# Patient Record
Sex: Male | Born: 1985 | Race: Black or African American | Hispanic: No | Marital: Single | Smoking: Current every day smoker
Health system: Southern US, Community
[De-identification: ages and names within clinical notes are randomized; demographics above are authoritative.]

## PROBLEM LIST (undated history)

## (undated) DIAGNOSIS — F909 Attention-deficit hyperactivity disorder, unspecified type: Secondary | ICD-10-CM

## (undated) DIAGNOSIS — J302 Other seasonal allergic rhinitis: Secondary | ICD-10-CM

---
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
## 2006-09-27 ENCOUNTER — Emergency Department (HOSPITAL_COMMUNITY): Admission: EM | Admit: 2006-09-27 | Discharge: 2006-09-27 | Payer: Self-pay | Admitting: Emergency Medicine

## 2007-12-31 ENCOUNTER — Emergency Department (HOSPITAL_COMMUNITY): Admission: EM | Admit: 2007-12-31 | Discharge: 2007-12-31 | Payer: Self-pay | Admitting: Emergency Medicine

## 2008-03-01 ENCOUNTER — Emergency Department (HOSPITAL_COMMUNITY): Admission: EM | Admit: 2008-03-01 | Discharge: 2008-03-01 | Payer: Self-pay | Admitting: Emergency Medicine

## 2011-06-29 ENCOUNTER — Emergency Department (HOSPITAL_COMMUNITY)
Admission: EM | Admit: 2011-06-29 | Discharge: 2011-06-29 | Disposition: A | Discharge status: Home / Self Care | Payer: Managed Care, Other (non HMO) | Attending: Emergency Medicine | Admitting: Emergency Medicine

## 2011-06-29 ENCOUNTER — Encounter: Payer: Self-pay | Admitting: *Deleted

## 2011-06-29 DIAGNOSIS — F419 Anxiety disorder, unspecified: Secondary | ICD-10-CM

## 2011-06-29 DIAGNOSIS — F909 Attention-deficit hyperactivity disorder, unspecified type: Secondary | ICD-10-CM | POA: Insufficient documentation

## 2011-06-29 DIAGNOSIS — IMO0002 Reserved for concepts with insufficient information to code with codable children: Secondary | ICD-10-CM | POA: Insufficient documentation

## 2011-06-29 DIAGNOSIS — F411 Generalized anxiety disorder: Secondary | ICD-10-CM | POA: Insufficient documentation

## 2011-06-29 HISTORY — DX: Attention-deficit hyperactivity disorder, unspecified type: F90.9
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
## 2011-06-29 NOTE — ED Notes (Signed)
Patient states that he lost his phone today and became very upset and according to his mom, patient states that "he was going to drive the car over a cliff!"  Patient states that he was very angry and denies any suicidal ideations

## 2011-06-29 NOTE — ED Notes (Signed)
Patient lost his phone today and became very upset to the point he wanted to drive his car off a cliff states he was just upset and not really wanting to hurt himself.

## 2011-06-30 NOTE — ED Provider Notes (Signed)
Medical screening examination/treatment/procedure(s) were performed by non-physician practitioner and as supervising physician I was immediately available for consultation/collaboration.   Andrew King, MD 06/30/11 0142 

## 2011-11-29 ENCOUNTER — Emergency Department (HOSPITAL_COMMUNITY)
Admission: EM | Admit: 2011-11-29 | Discharge: 2011-11-30 | Disposition: A | Discharge status: Home / Self Care | Payer: Managed Care, Other (non HMO) | Attending: Emergency Medicine | Admitting: Emergency Medicine

## 2011-11-29 ENCOUNTER — Emergency Department (HOSPITAL_COMMUNITY): Payer: Managed Care, Other (non HMO)

## 2011-11-29 ENCOUNTER — Encounter (HOSPITAL_COMMUNITY): Payer: Self-pay | Admitting: Emergency Medicine

## 2011-11-29 DIAGNOSIS — J069 Acute upper respiratory infection, unspecified: Secondary | ICD-10-CM | POA: Insufficient documentation

## 2011-11-29 DIAGNOSIS — R42 Dizziness and giddiness: Secondary | ICD-10-CM | POA: Insufficient documentation

## 2011-11-29 DIAGNOSIS — J329 Chronic sinusitis, unspecified: Secondary | ICD-10-CM | POA: Insufficient documentation

## 2011-11-29 DIAGNOSIS — R0602 Shortness of breath: Secondary | ICD-10-CM | POA: Insufficient documentation

## 2011-11-29 DIAGNOSIS — R509 Fever, unspecified: Secondary | ICD-10-CM | POA: Insufficient documentation

## 2011-11-29 DIAGNOSIS — F17200 Nicotine dependence, unspecified, uncomplicated: Secondary | ICD-10-CM | POA: Insufficient documentation

## 2011-11-29 HISTORY — DX: Other seasonal allergic rhinitis: J30.2

## 2011-11-29 MED ORDER — ACETAMINOPHEN 325 MG PO TABS
650.0000 mg | ORAL_TABLET | Freq: Once | ORAL | Status: AC
  Administered 2011-11-29: 650 mg via ORAL
  Filled 2011-11-29: qty 2

## 2011-11-29 NOTE — ED Notes (Signed)
Remains in waiting area.  Fever reduced.  Updated on wait for treatment room.

## 2011-11-29 NOTE — ED Notes (Signed)
PT. REPORTS NASAL CONGESTION , DRY COUGH ,SOB /DIZZINESS FOR SEVERAL WEEKS UNRELIEVED BY OTC ALLERGY MEDICATIONS/MUCINEX.

## 2011-11-30 MED ORDER — AZITHROMYCIN 250 MG PO TABS: 250.0000 mg | ORAL_TABLET | Freq: Every day | ORAL | Status: AC

## 2011-11-30 MED ORDER — FLUTICASONE PROPIONATE 50 MCG/ACT NA SUSP: 2.0000 | Freq: Every day | NASAL | Status: DC

## 2011-11-30 MED ORDER — CETIRIZINE HCL 10 MG PO TABS: 10.0000 mg | ORAL_TABLET | Freq: Every day | ORAL | Status: DC

## 2011-11-30 NOTE — ED Provider Notes (Signed)
Medical screening examination/treatment/procedure(s) were performed by non-physician practitioner and as supervising physician I was immediately available for consultation/collaboration.   Lyanne Co, MD 11/30/11 [phone]

## 2011-11-30 NOTE — Discharge Instructions (Signed)
Mason Lopez's symptoms are most likely are due to a viral upper respiratory and sinus infection. Make sure to take zyrtec daily. Take sudafed for congestion. Saline nasal spray every  2 hrs. Flonase daily. Drink plenty of fluids, rest. Take zithromax if symptoms are not improving in several days. Follow up with your doctor.   Viral Syndrome You or your child has Viral Syndrome. It is the most common infection causing "colds" and infections in the nose, throat, sinuses, and breathing tubes. Sometimes the infection causes nausea, vomiting, or diarrhea. The germ that causes the infection is a virus. No antibiotic or other medicine will kill it. There are medicines that you can take to make you or your child more comfortable.  HOME CARE INSTRUCTIONS   Rest in bed until you start to feel better.   If you have diarrhea or vomiting, eat small amounts of crackers and toast. Soup is helpful.   Do not give aspirin or medicine that contains aspirin to children.   Only take over-the-counter or prescription medicines for pain, discomfort, or fever as directed by your caregiver.  SEEK IMMEDIATE MEDICAL CARE IF:   You or your child has not improved within one week.   You or your child has pain that is not at least partially relieved by over-the-counter medicine.   Thick, colored mucus or blood is coughed up.   Discharge from the nose becomes thick yellow or green.   Diarrhea or vomiting gets worse.   There is any major change in your or your child's condition.   You or your child develops a skin rash, stiff neck, severe headache, or are unable to hold down food or fluid.   You or your child has an oral temperature above 102 F (38.9 C), not controlled by medicine.   Your baby is older than 3 months with a rectal temperature of 102 F (38.9 C) or higher.   Your baby is 32 months old or younger with a rectal temperature of 100.4 F (38 C) or higher.  Document Released: 06/21/2006 Document Revised:  06/25/2011 Document Reviewed: 06/22/2007 [facility] Patient Information 2012 [place], [place]. Antibiotic Nonuse  Your caregiver felt that the infection or problem was not one that would be helped with an antibiotic. Infections may be caused by viruses or bacteria. Only a caregiver can tell which one of these is the likely cause of an illness. A cold is the most common cause of infection in both adults and children. A cold is a virus. Antibiotic treatment will have no effect on a viral infection. Viruses can lead to many lost days of work caring for sick children and many missed days of school. Children may catch as many as 10 "colds" or "flus" per year during which they can be tearful, cranky, and uncomfortable. The goal of treating a virus is aimed at keeping the ill person comfortable. Antibiotics are medications used to help the body fight bacterial infections. There are relatively few types of bacteria that cause infections but there are hundreds of viruses. While both viruses and bacteria cause infection they are very different types of germs. A viral infection will typically go away by itself within 7 to 10 days. Bacterial infections may spread or get worse without antibiotic treatment. Examples of bacterial infections are:  Sore throats (like strep throat or tonsillitis).   Infection in the lung (pneumonia).   Ear and skin infections.  Examples of viral infections are:  Colds or flus.   Most coughs and bronchitis.  Sore throats not caused by Strep.   Runny noses.  It is often best not to take an antibiotic when a viral infection is the cause of the problem. Antibiotics can kill off the helpful bacteria that we have inside our body and allow harmful bacteria to start growing. Antibiotics can cause side effects such as allergies, nausea, and diarrhea without helping to improve the symptoms of the viral infection. Additionally, repeated uses of antibiotics can cause bacteria inside of our  body to become resistant. That resistance can be passed onto harmful bacterial. The next time you have an infection it may be harder to treat if antibiotics are used when they are not needed. Not treating with antibiotics allows our own immune system to develop and take care of infections more efficiently. Also, antibiotics will work better for [place] when they are prescribed for bacterial infections. Treatments for a child that is ill may include:  Give extra fluids throughout the day to stay hydrated.   Get plenty of rest.   Only give your child over-the-counter or prescription medicines for pain, discomfort, or fever as directed by your caregiver.   The use of a cool mist humidifier may help stuffy noses.   Cold medications if suggested by your caregiver.  Your caregiver may decide to start you on an antibiotic if:  The problem you were seen for today continues for a longer length of time than expected.   You develop a secondary bacterial infection.  SEEK MEDICAL CARE IF:  Fever lasts longer than 5 days.   Symptoms continue to get worse after 5 to 7 days or become severe.   Difficulty in breathing develops.   Signs of dehydration develop (poor drinking, rare urinating, dark colored urine).   Changes in behavior or worsening tiredness (listlessness or lethargy).  Document Released: 09/14/2001 Document Revised: 06/25/2011 Document Reviewed: 03/13/2009 [facility] Patient Information 2012 [place], [place].

## 2011-11-30 NOTE — ED Provider Notes (Signed)
History     CSN: [id number]  Arrival date & time 11/29/11  2219   First MD Initiated Contact with Patient 11/30/11 0009      Chief Complaint  Patient presents with  . Allergies    (Consider location/radiation/quality/duration/timing/severity/associated sxs/prior treatment) Patient is a 26 y.o. male presenting with URI. The history is provided by the patient.  URI The primary symptoms include fever, sore throat, cough and myalgias. Primary symptoms do not include headaches, ear pain, abdominal pain, nausea or vomiting. The current episode started 3 to 5 days ago. This is a new problem. The problem has been gradually worsening.  Symptoms associated with the illness include chills, plugged ear sensation, facial pain, sinus pressure, congestion and rhinorrhea.  PT states he tried to take mucinex and over the counter cold medications with no relief. Nothing makes his symptoms better or worse. Denies headache, neck pain or stiffness.  Past Medical History  Diagnosis Date  . ADHD (attention deficit hyperactivity disorder)   . Seasonal allergies   . Asthma     History reviewed. No pertinent past surgical history.  No family history on file.  History  Substance Use Topics  . Smoking status: Current Everyday Smoker -- 0.5 packs/day  . Smokeless tobacco: Not on file  . Alcohol Use: Yes      Review of Systems  Constitutional: Positive for fever and chills.  HENT: Positive for congestion, sore throat, rhinorrhea and sinus pressure. Negative for ear pain, neck pain and neck stiffness.   Respiratory: Positive for cough. Negative for chest tightness and shortness of breath.   Cardiovascular: Negative.   Gastrointestinal: Negative for nausea, vomiting and abdominal pain.  Musculoskeletal: Positive for myalgias.  Skin: Negative.   Neurological: Positive for dizziness. Negative for headaches.    Allergies  Review of patient's allergies indicates no known allergies.  Home  Medications   Current Outpatient Rx  Name Route Sig Dispense Refill  . CETIRIZINE HCL 10 MG PO TABS Oral Take 10 mg by mouth daily.      BP 125/76  Pulse 95  Temp(Src) 100.1 F (37.8 C) (Oral)  Resp 18  SpO2 99%  Physical Exam  Nursing note and vitals reviewed. Constitutional: He is oriented to person, place, and time. He appears well-developed and well-nourished. No distress.  HENT:  Head: Normocephalic and atraumatic.  Right Ear: Tympanic membrane, external ear and ear canal normal.  Left Ear: Tympanic membrane, external ear and ear canal normal.  Nose: Rhinorrhea present. Right sinus exhibits maxillary sinus tenderness. Right sinus exhibits no frontal sinus tenderness. Left sinus exhibits maxillary sinus tenderness. Left sinus exhibits no frontal sinus tenderness.  Mouth/Throat: Uvula is midline, oropharynx is clear and moist and mucous membranes are normal.  Eyes: Conjunctivae are normal.  Neck: Normal range of motion. Neck supple.  Cardiovascular: Normal rate, regular rhythm and normal heart sounds.   Pulmonary/Chest: Effort normal and breath sounds normal. No respiratory distress. He has no wheezes.  Abdominal: Bowel sounds are normal. He exhibits no distension. There is no tenderness.  Lymphadenopathy:    He has no cervical adenopathy.  Neurological: He is alert and oriented to person, place, and time.  Skin: Skin is warm and dry.  Psychiatric: He has a normal mood and affect.    ED Course  Procedures (including critical care time)  Labs Reviewed - No data to display Dg Chest 2 View  11/29/2011  *RADIOLOGY REPORT*  Clinical Data: Cough and shortness of breath for 1 week  CHEST -  2 VIEW  Comparison: None.  Findings: Normal heart size and pulmonary vascularity.  No focal airspace consolidation in the lungs.  No blunting of costophrenic angles.  No pneumothorax.  IMPRESSION: No evidence of active pulmonary disease.  Original Report Authenticated By: Marlon Pel,  M.D.   PT with URI symptoms. Fever of 103.1 on presentation. VS normal otherwise. He does not look toxic. His CXR negative, he is otherwise healthy. Lungs clear. NO meningismus.  Mother asking for antibitoic, explained to her, this is most likely a viral infection and does not require antibiotics. WIll give her one to hold on to, if symptoms not improving.   1. URI (upper respiratory infection)   2. Sinusitis       MDM         Lottie Mussel, PA 11/30/11 0126

## 2011-11-30 NOTE — ED Notes (Signed)
Patient complaining of nasal congestion, "stuffy head", chills and shortness of breath since Thursday.  Patient states that he has year-round allergies and asthma problems (when he was a kid; reports no problems in the last several years).  Denies chset pain.  Patient alert and oriented x4; PERRL present.  Upon arrival to room, patient changed into gown and connected to continuous blood pressure and pulse ox.  Family present at bedside.  Will continue to monitor.

## 2011-11-30 NOTE — ED Notes (Signed)
Patient given discharge paperwork; went over discharge instructions with patient.  Patient instructed to take prescriptions as directed, to take OTC saline nasal spray, to follow up with primary care physician if symptoms do not improve, to get plenty of rest and drink plenty of fluids, and to return to the ED for new, worsening, or concerning symptoms.

## 2012-12-18 ENCOUNTER — Encounter (HOSPITAL_COMMUNITY): Payer: Self-pay | Admitting: *Deleted

## 2012-12-18 ENCOUNTER — Emergency Department (HOSPITAL_COMMUNITY)
Admission: EM | Admit: 2012-12-18 | Discharge: 2012-12-18 | Disposition: A | Discharge status: Home / Self Care | Payer: Managed Care, Other (non HMO) | Attending: Emergency Medicine | Admitting: Emergency Medicine

## 2012-12-18 DIAGNOSIS — Z0289 Encounter for other administrative examinations: Secondary | ICD-10-CM | POA: Insufficient documentation

## 2012-12-18 DIAGNOSIS — F17200 Nicotine dependence, unspecified, uncomplicated: Secondary | ICD-10-CM | POA: Insufficient documentation

## 2012-12-18 DIAGNOSIS — Z008 Encounter for other general examination: Secondary | ICD-10-CM

## 2012-12-18 DIAGNOSIS — F1210 Cannabis abuse, uncomplicated: Secondary | ICD-10-CM | POA: Insufficient documentation

## 2012-12-18 DIAGNOSIS — F909 Attention-deficit hyperactivity disorder, unspecified type: Secondary | ICD-10-CM | POA: Insufficient documentation

## 2012-12-18 DIAGNOSIS — J45909 Unspecified asthma, uncomplicated: Secondary | ICD-10-CM | POA: Insufficient documentation

## 2012-12-18 LAB — RAPID URINE DRUG SCREEN, HOSP PERFORMED
Amphetamines: NOT DETECTED
Barbiturates: NOT DETECTED
Benzodiazepines: NOT DETECTED
Cocaine: NOT DETECTED
Opiates: NOT DETECTED
Tetrahydrocannabinol: POSITIVE — AB

## 2012-12-18 LAB — COMPREHENSIVE METABOLIC PANEL
ALT: 22 U/L (ref 0–53)
AST: 26 U/L (ref 0–37)
Albumin: 4.1 g/dL (ref 3.5–5.2)
Alkaline Phosphatase: 63 U/L (ref 39–117)
BUN: 11 mg/dL (ref 6–23)
CO2: 30 mEq/L (ref 19–32)
Calcium: 9.6 mg/dL (ref 8.4–10.5)
Chloride: 101 mEq/L (ref 96–112)
Creatinine, Ser: 1.03 mg/dL (ref 0.50–1.35)
GFR calc Af Amer: >90 mL/min (ref >90)
GFR calc non Af Amer: >90 mL/min (ref >90)
Glucose, Bld: 87 mg/dL (ref 70–99)
Potassium: 3.8 mEq/L (ref 3.5–5.1)
Sodium: 140 mEq/L (ref 135–145)
Total Bilirubin: 1.1 mg/dL (ref 0.3–1.2)
Total Protein: 7.9 g/dL (ref 6.0–8.3)

## 2012-12-18 LAB — ACETAMINOPHEN LEVEL: Acetaminophen (Tylenol), Serum: <15 ug/mL (ref 10–30)

## 2012-12-18 LAB — CBC
HCT: 43 % (ref 39.0–52.0)
Hemoglobin: 15.3 g/dL (ref 13.0–17.0)
MCH: 31.2 pg (ref 26.0–34.0)
MCHC: 35.6 g/dL (ref 30.0–36.0)
MCV: 87.8 fL (ref 78.0–100.0)
Platelets: 262 10*3/uL (ref 150–400)
RBC: 4.9 MIL/uL (ref 4.22–5.81)
RDW: 12.4 % (ref 11.5–15.5)
WBC: 7.9 10*3/uL (ref 4.0–10.5)

## 2012-12-18 LAB — SALICYLATE LEVEL: Salicylate Lvl: <2 mg/dL — ABNORMAL LOW (ref 2.8–20.0)

## 2012-12-18 LAB — ETHANOL: Alcohol, Ethyl (B): <11 mg/dL (ref 0–11)

## 2012-12-18 MED ORDER — NICOTINE 21 MG/24HR TD PT24: 21.0000 mg | MEDICATED_PATCH | Freq: Every day | TRANSDERMAL | Status: DC

## 2012-12-18 MED ORDER — LORAZEPAM 1 MG PO TABS: 1.0000 mg | ORAL_TABLET | Freq: Three times a day (TID) | ORAL | Status: DC | PRN

## 2012-12-18 MED ORDER — IBUPROFEN 600 MG PO TABS: 600.0000 mg | ORAL_TABLET | Freq: Three times a day (TID) | ORAL | Status: DC | PRN

## 2012-12-18 MED ORDER — ONDANSETRON HCL 4 MG PO TABS: 4.0000 mg | ORAL_TABLET | Freq: Three times a day (TID) | ORAL | Status: DC | PRN

## 2012-12-18 MED ORDER — ZOLPIDEM TARTRATE 5 MG PO TABS: 5.0000 mg | ORAL_TABLET | Freq: Every evening | ORAL | Status: DC | PRN

## 2012-12-18 MED ORDER — LORATADINE 10 MG PO TABS
10.0000 mg | ORAL_TABLET | Freq: Every day | ORAL | Status: DC
  Filled 2012-12-18: qty 1

## 2012-12-18 MED ORDER — ALUM & MAG HYDROXIDE-SIMETH 200-200-20 MG/5ML PO SUSP: 30.0000 mL | ORAL | Status: DC | PRN
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
## 2012-12-18 NOTE — ED Notes (Signed)
Pt wanded by security, 1 pt belonging bag.

## 2012-12-18 NOTE — ED Notes (Signed)
Telepsych info faxed & called in.
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
## 2012-12-18 NOTE — [facility] Assessment (Signed)
[facility] Assessment Progress Note    Spoke to Girard the PA and she informed [facility] pt is petitioned by his mother.  Pt has been cooperative in ED.  Pt is concerned about work Advertising account executive.  Pt will be Tele Psych by ED to determine if pt needs to be IVC.  If Tele Psych examination upholds IVC then ACT will conduct and assessment and begin to pursue placement options.    However, if Tele Psych rescinds IVC after as a result of their examination then [facility] will not be needed except to offer referral information.    PA was in agreement with the process of the above plan.

## 2012-12-19 NOTE — ED Provider Notes (Signed)
Medical screening examination/treatment/procedure(s) were performed by non-physician practitioner and as supervising physician I was immediately available for consultation/collaboration.  Burlie Cajamarca, MD 12/19/12 1548 

## 2014-10-21 ENCOUNTER — Encounter (HOSPITAL_COMMUNITY): Payer: Self-pay | Admitting: Emergency Medicine

## 2014-10-21 ENCOUNTER — Emergency Department (INDEPENDENT_AMBULATORY_CARE_PROVIDER_SITE_OTHER)
Admission: EM | Admit: 2014-10-21 | Discharge: 2014-10-21 | Disposition: A | Discharge status: Home / Self Care | Payer: Self-pay | Source: Home / Self Care | Attending: Emergency Medicine | Admitting: Emergency Medicine

## 2014-10-21 DIAGNOSIS — H66002 Acute suppurative otitis media without spontaneous rupture of ear drum, left ear: Secondary | ICD-10-CM

## 2014-10-21 DIAGNOSIS — K0889 Other specified disorders of teeth and supporting structures: Secondary | ICD-10-CM

## 2014-10-21 DIAGNOSIS — K088 Other specified disorders of teeth and supporting structures: Secondary | ICD-10-CM

## 2014-10-21 DIAGNOSIS — S025XXA Fracture of tooth (traumatic), initial encounter for closed fracture: Secondary | ICD-10-CM

## 2014-10-21 MED ORDER — AMOXICILLIN 500 MG PO CAPS: 500.0000 mg | ORAL_CAPSULE | Freq: Three times a day (TID) | ORAL | Status: DC

## 2014-10-21 MED ORDER — HYDROCODONE-ACETAMINOPHEN 5-325 MG PO TABS: 1.0000 | ORAL_TABLET | ORAL | Status: DC | PRN

## 2014-10-21 MED ORDER — MONTELUKAST SODIUM 10 MG PO TABS
10.0000 mg | ORAL_TABLET | Freq: Every day | ORAL | Status: DC
Start: 2014-10-21 — End: 2015-01-23

## 2014-10-21 NOTE — Discharge Instructions (Signed)
You have an infection in your left ear. Take amoxicillin 1 pill 3 times a day for 1 week. Add Singulair to your allergy regimen. Please follow-up with a dentist as soon as possible for evaluation of your chipped tooth and also the jaw pain you are having.

## 2014-10-21 NOTE — ED Notes (Signed)
C/o left side dental pain which radiates to left ear and temple which started yesterday States pain is a pressure pain Left side of face is swollen Did admit to chipping tooth last week Aleve and mouth wash used as tx

## 2014-10-21 NOTE — ED Provider Notes (Signed)
CSN: [id number]     Arrival date & time 10/21/14  1129 History   First MD Initiated Contact with Patient 10/21/14 1153     Chief Complaint  Patient presents with  . Dental Pain   (Consider location/radiation/quality/duration/timing/severity/associated sxs/prior Treatment) HPI  He is a 29 year old man here for evaluation of dental pain. He states this started yesterday. It is in his left upper jaw by the posterior 2 teeth. He describes it as a pinching pressure sensation. It radiates into his left ear and left temple. No pain with jaw movement. He states he did chip his left lower molar a week ago, but that has not caused him any pain. He reports some swelling of the left side of his face. No fevers. He has tried Aleve without any improvement. He does have dental insurance, but does not have a dentist at this time.  He states he does have bad allergies. He reports worse congestion over the last few days than previously. He is taking Claritin daily.  Past Medical History  Diagnosis Date  . ADHD (attention deficit hyperactivity disorder)   . Seasonal allergies   . Asthma    History reviewed. No pertinent past surgical history. History reviewed. No pertinent family history. History  Substance Use Topics  . Smoking status: Current Every Day Smoker -- 0.50 packs/day  . Smokeless tobacco: Never Used  . Alcohol Use: Yes    Review of Systems  Constitutional: Negative for fever and chills.  HENT: Positive for dental problem and ear pain. Negative for congestion and rhinorrhea.     Allergies  Peanuts  Home Medications   Prior to Admission medications   Medication Sig Start Date End Date Taking? Authorizing Provider  amoxicillin (AMOXIL) 500 MG capsule Take 1 capsule (500 mg total) by mouth 3 (three) times daily. 10/21/14   Charm RingsErin J Zarayah Lanting, MD  cetirizine (ZYRTEC) 10 MG tablet Take 10 mg by mouth daily.    Historical Provider, MD  HYDROcodone-acetaminophen (NORCO/VICODIN) 5-325 MG per tablet  Take 1 tablet by mouth every 4 (four) hours as needed for moderate pain. 10/21/14   Charm RingsErin J Leidi Astle, MD  montelukast (SINGULAIR) 10 MG tablet Take 1 tablet (10 mg total) by mouth at bedtime. 10/21/14   Charm RingsErin J Naina Sleeper, MD   BP 151/98 mmHg  Pulse 64  Temp(Src) 98 F (36.7 C) (Oral)  Resp 20  SpO2 100% Physical Exam  Constitutional: He is oriented to person, place, and time. He appears well-developed and well-nourished. No distress.  HENT:  No tenderness to percussion of the involved teeth. No appreciable erythema of the gumline. Left TM is erythematous and bulging.  Neck: Neck supple.  Cardiovascular: Normal rate.   Pulmonary/Chest: Effort normal.  Lymphadenopathy:    He has no cervical adenopathy.  Neurological: He is alert and oriented to person, place, and time.    ED Course  Procedures (including critical care time) Labs Review Labs Reviewed - No data to display  Imaging Review No results found.   MDM   1. Acute suppurative otitis media of left ear without spontaneous rupture of tympanic membrane, recurrence not specified   2. Pain, dental   3. Broken tooth, closed, initial encounter    Amoxicillin for the ear infection. We'll add Singulair for his allergies. Prescription for Norco given to use for his dental pain.  He will follow-up with a dentist as soon as possible.    Charm RingsErin J Astrid Vides, MD 10/21/14 [phone]

## 2015-01-17 ENCOUNTER — Emergency Department (HOSPITAL_COMMUNITY)
Admission: EM | Admit: 2015-01-17 | Discharge: 2015-01-19 | Disposition: A | Discharge status: Other Acute Inpatient Hospital | Payer: BLUE CROSS/BLUE SHIELD | Attending: Emergency Medicine | Admitting: Emergency Medicine

## 2015-01-17 DIAGNOSIS — F332 Major depressive disorder, recurrent severe without psychotic features: Secondary | ICD-10-CM | POA: Diagnosis present

## 2015-01-17 DIAGNOSIS — J45909 Unspecified asthma, uncomplicated: Secondary | ICD-10-CM | POA: Insufficient documentation

## 2015-01-17 DIAGNOSIS — F1210 Cannabis abuse, uncomplicated: Secondary | ICD-10-CM | POA: Insufficient documentation

## 2015-01-17 DIAGNOSIS — Z792 Long term (current) use of antibiotics: Secondary | ICD-10-CM | POA: Insufficient documentation

## 2015-01-17 DIAGNOSIS — Z79899 Other long term (current) drug therapy: Secondary | ICD-10-CM | POA: Insufficient documentation

## 2015-01-17 DIAGNOSIS — F411 Generalized anxiety disorder: Secondary | ICD-10-CM | POA: Diagnosis present

## 2015-01-17 DIAGNOSIS — F39 Unspecified mood [affective] disorder: Secondary | ICD-10-CM | POA: Diagnosis present

## 2015-01-17 DIAGNOSIS — Z720 Tobacco use: Secondary | ICD-10-CM | POA: Insufficient documentation

## 2015-01-17 DIAGNOSIS — F911 Conduct disorder, childhood-onset type: Secondary | ICD-10-CM | POA: Insufficient documentation

## 2015-01-17 DIAGNOSIS — R45851 Suicidal ideations: Secondary | ICD-10-CM

## 2015-01-18 ENCOUNTER — Encounter (HOSPITAL_COMMUNITY): Payer: Self-pay

## 2015-01-18 DIAGNOSIS — F39 Unspecified mood [affective] disorder: Secondary | ICD-10-CM | POA: Diagnosis not present

## 2015-01-18 LAB — COMPREHENSIVE METABOLIC PANEL
ALBUMIN: 4.7 g/dL (ref 3.5–5.0)
ALT: 47 U/L (ref 17–63)
AST: 31 U/L (ref 15–41)
Alkaline Phosphatase: 67 U/L (ref 38–126)
Anion gap: 11 (ref 5–15)
BILIRUBIN TOTAL: 0.8 mg/dL (ref 0.3–1.2)
BUN: 14 mg/dL (ref 6–20)
CHLORIDE: 106 mmol/L (ref 101–111)
CO2: 23 mmol/L (ref 22–32)
CREATININE: 1 mg/dL (ref 0.61–1.24)
Calcium: 9.6 mg/dL (ref 8.9–10.3)
Glucose, Bld: 94 mg/dL (ref 65–99)
Potassium: 3.5 mmol/L (ref 3.5–5.1)
SODIUM: 140 mmol/L (ref 135–145)
TOTAL PROTEIN: 8.6 g/dL — AB (ref 6.5–8.1)

## 2015-01-18 LAB — CBC
HCT: 45.7 % (ref 39.0–52.0)
HEMOGLOBIN: 16 g/dL (ref 13.0–17.0)
MCH: 31.4 pg (ref 26.0–34.0)
MCHC: 35 g/dL (ref 30.0–36.0)
MCV: 89.8 fL (ref 78.0–100.0)
PLATELETS: 301 10*3/uL (ref 150–400)
RBC: 5.09 MIL/uL (ref 4.22–5.81)
RDW: 12.3 % (ref 11.5–15.5)
WBC: 10 10*3/uL (ref 4.0–10.5)

## 2015-01-18 LAB — RAPID URINE DRUG SCREEN, HOSP PERFORMED
AMPHETAMINES: NOT DETECTED
BENZODIAZEPINES: NOT DETECTED
Barbiturates: NOT DETECTED
Cocaine: NOT DETECTED
OPIATES: NOT DETECTED
Tetrahydrocannabinol: POSITIVE — AB

## 2015-01-18 LAB — ETHANOL: Alcohol, Ethyl (B): <5 mg/dL (ref <5)

## 2015-01-18 LAB — ACETAMINOPHEN LEVEL: Acetaminophen (Tylenol), Serum: <10 ug/mL — ABNORMAL LOW (ref 10–30)

## 2015-01-18 LAB — SALICYLATE LEVEL: Salicylate Lvl: <4 mg/dL (ref 2.8–30.0)

## 2015-01-18 MED ORDER — HYDROXYZINE HCL 25 MG PO TABS: 25.0000 mg | ORAL_TABLET | Freq: Three times a day (TID) | ORAL | Status: DC | PRN

## 2015-01-18 MED ORDER — ACETAMINOPHEN 325 MG PO TABS: 650.0000 mg | ORAL_TABLET | ORAL | Status: DC | PRN

## 2015-01-18 MED ORDER — ZIPRASIDONE MESYLATE 20 MG IM SOLR: 10.0000 mg | Freq: Once | INTRAMUSCULAR | Status: DC | PRN

## 2015-01-18 MED ORDER — ONDANSETRON HCL 4 MG PO TABS: 4.0000 mg | ORAL_TABLET | Freq: Three times a day (TID) | ORAL | Status: DC | PRN

## 2015-01-18 MED ORDER — TRAZODONE HCL 50 MG PO TABS: 50.0000 mg | ORAL_TABLET | Freq: Every day | ORAL | Status: DC

## 2015-01-18 MED ORDER — ZIPRASIDONE MESYLATE 20 MG IM SOLR
20.0000 mg | Freq: Once | INTRAMUSCULAR | Status: AC
  Administered 2015-01-18: 20 mg via INTRAMUSCULAR
  Filled 2015-01-18: qty 20

## 2015-01-18 MED ORDER — CITALOPRAM HYDROBROMIDE 10 MG PO TABS
10.0000 mg | ORAL_TABLET | Freq: Every day | ORAL | Status: DC
  Filled 2015-01-18: qty 1

## 2015-01-18 NOTE — ED Notes (Signed)
Pt refused Celexa and says he thinks he should be discharged. Pt remained calm and cooperative. NP notified.

## 2015-01-18 NOTE — ED Notes (Signed)
Mason Lopez has remained in bed for most of the a.m. He has been calm and appropriate when interacting with this Clinical research associatewriter, however. Will continue to monitor for needs/safety.

## 2015-01-18 NOTE — ED Notes (Signed)
Pt. States that he is feeling better now after his medication. Pt. States that [facility] and can now maintain control. Pt. Made aware of the need to remain calm and cooperative in [facility] future so that he can begin his treatment.

## 2015-01-18 NOTE — ED Notes (Signed)
Patient wanded by security and belongings searched.

## 2015-01-18 NOTE — ED Notes (Signed)
Pt. Noted sleeping in room. No complaints or concerns voiced. No distress or abnormal behavior noted. Will continue to monitor with security cameras. Q 15 minute rounds continue. 

## 2015-01-18 NOTE — ED Notes (Signed)
Pt. Yelling at nurses station about his need to "see a doctor", pounding on the window of the nurses station.
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
## 2015-01-18 NOTE — ED Notes (Signed)
Pt denied hitting the oxygen cabinet. Staff discussed with him the need to remain calm if he did not want meds. Pt verbalized understanding. Pt complained that he is bored and upset about having to stay here. He complained about the volume of another patient's speech. He said that having to stay here would "make someone suicidal." Offered pt activities to alleviate boredom (remote control, magazines). Pt declined but later approached staff for remote. Will continue to monitor for needs/safety.

## 2015-01-18 NOTE — ED Notes (Signed)
Report received from Karen Shugart RN. Pt. Alert and oriented in no distress denies SI, HI, AVH and pain.  Pt. Instructed to come to me with problems or concerns.Will continue to monitor for safety via security cameras and Q 15 minute checks. 
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
## 2015-01-18 NOTE — ED Notes (Signed)
Pt. Argumentative concerning his treatment plan and complaining that he has "not had any treatment today". Pts. Treatment plan explained and update concerning his bed status relayed to him after [facility]. Pt. Starting to get agitated related to his not being able to see the Psychiatrist tonight. Pt. Informed that he would be reassessed by Psychiatrist in AM.
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
## 2015-01-18 NOTE — ED Notes (Signed)
Mom here at bedside

## 2015-01-18 NOTE — ED Notes (Signed)
Mason Lopez's mom is visiting with him now. Pt remains upset about stay, but has lowered his (briefly) raised voice.
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
## 2015-01-18 NOTE — ED Notes (Signed)
Pt. Noted in room. Pt. Still moderately agitated, but not shouting.  Will continue to monitor with security cameras. Q 15 minute rounds continue.

## 2015-01-18 NOTE — ED Notes (Signed)
PT STATED THAT HE DID NOT WANT LAB WORK.   INFORMED RN.

## 2015-01-18 NOTE — ED Notes (Signed)
Sitter at bedside at this time 
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
## 2015-01-19 ENCOUNTER — Inpatient Hospital Stay (HOSPITAL_COMMUNITY)
Admission: AD | Admit: 2015-01-19 | Discharge: 2015-01-23 | DRG: 885 | Disposition: A | Discharge status: Home / Self Care | Payer: Federal, State, Local not specified - Other | Source: Intra-hospital | Attending: Psychiatry | Admitting: Psychiatry

## 2015-01-19 ENCOUNTER — Encounter (HOSPITAL_COMMUNITY): Payer: Self-pay | Admitting: *Deleted

## 2015-01-19 DIAGNOSIS — F909 Attention-deficit hyperactivity disorder, unspecified type: Secondary | ICD-10-CM | POA: Diagnosis not present

## 2015-01-19 DIAGNOSIS — F1210 Cannabis abuse, uncomplicated: Secondary | ICD-10-CM | POA: Diagnosis present

## 2015-01-19 DIAGNOSIS — F410 Panic disorder [episodic paroxysmal anxiety] without agoraphobia: Secondary | ICD-10-CM | POA: Diagnosis present

## 2015-01-19 DIAGNOSIS — F411 Generalized anxiety disorder: Secondary | ICD-10-CM | POA: Diagnosis present

## 2015-01-19 DIAGNOSIS — F17210 Nicotine dependence, cigarettes, uncomplicated: Secondary | ICD-10-CM | POA: Diagnosis present

## 2015-01-19 DIAGNOSIS — F332 Major depressive disorder, recurrent severe without psychotic features: Secondary | ICD-10-CM | POA: Diagnosis present

## 2015-01-19 DIAGNOSIS — G4700 Insomnia, unspecified: Secondary | ICD-10-CM | POA: Diagnosis present

## 2015-01-19 DIAGNOSIS — F329 Major depressive disorder, single episode, unspecified: Secondary | ICD-10-CM | POA: Diagnosis not present

## 2015-01-19 DIAGNOSIS — F39 Unspecified mood [affective] disorder: Secondary | ICD-10-CM | POA: Diagnosis not present

## 2015-01-19 LAB — TSH: TSH: 2.185 u[IU]/mL (ref 0.350–4.500)

## 2015-01-19 MED ORDER — NICOTINE POLACRILEX 2 MG MT GUM
2.0000 mg | CHEWING_GUM | OROMUCOSAL | Status: DC | PRN
  Administered 2015-01-19 – 2015-01-20 (×2): 2 mg via ORAL
  Filled 2015-01-19 (×2): qty 1

## 2015-01-19 MED ORDER — TRAZODONE HCL 50 MG PO TABS
50.0000 mg | ORAL_TABLET | Freq: Every evening | ORAL | Status: DC | PRN
  Filled 2015-01-19: qty 1

## 2015-01-19 MED ORDER — NICOTINE 14 MG/24HR TD PT24
14.0000 mg | MEDICATED_PATCH | Freq: Every day | TRANSDERMAL | Status: DC
  Administered 2015-01-19: 14 mg via TRANSDERMAL
  Filled 2015-01-19 (×3): qty 1

## 2015-01-19 MED ORDER — ACETAMINOPHEN 325 MG PO TABS: 650.0000 mg | ORAL_TABLET | Freq: Four times a day (QID) | ORAL | Status: DC | PRN

## 2015-01-19 MED ORDER — ADULT MULTIVITAMIN W/MINERALS CH
1.0000 | ORAL_TABLET | Freq: Every day | ORAL | Status: DC
  Administered 2015-01-19 – 2015-01-23 (×5): 1 via ORAL
  Filled 2015-01-19 (×7): qty 1

## 2015-01-19 MED ORDER — ALUM & MAG HYDROXIDE-SIMETH 200-200-20 MG/5ML PO SUSP: 30.0000 mL | ORAL | Status: DC | PRN

## 2015-01-19 MED ORDER — LORATADINE 10 MG PO TABS
10.0000 mg | ORAL_TABLET | Freq: Every day | ORAL | Status: DC
  Administered 2015-01-19 – 2015-01-23 (×5): 10 mg via ORAL
  Filled 2015-01-19 (×7): qty 1

## 2015-01-19 MED ORDER — MAGNESIUM HYDROXIDE 400 MG/5ML PO SUSP: 30.0000 mL | Freq: Every day | ORAL | Status: DC | PRN

## 2015-01-19 NOTE — [facility] Group Notes (Signed)
[facility] LCSW Group Therapy Note  01/19/2015 11:15 AM  Type of Therapy and Topic:  Group Therapy: Avoiding Self-Sabotaging and Enabling Behaviors  Participation Level:  None   Description of Group:   Learn how to identify obstacles, self-sabotaging and enabling behaviors, what are they, why do we do them and what needs do these behaviors meet? Discuss unhealthy relationships and how to have positive healthy boundaries with those that sabotage and enable. Explore aspects of self-sabotage and enabling in yourself and how to limit these self-destructive behaviors in everyday life.  Therapeutic Goals: 1. Patient will identify one obstacle that relates to self-sabotage and enabling behaviors 2. Patient will identify one personal self-sabotaging or enabling behavior they did prior to admission 3. Patient able to establish a plan to change the above identified behavior they did prior to admission:  4. Patient will demonstrate ability to communicate their needs through discussion and/or role plays.   Summary of Patient Progress: The main focus of today's process group was to explain to the adolescent what "self-sabotage" means and use Motivational Interviewing to discuss what benefits, negative or positive, were involved in a self-identified self-sabotaging behavior. We then talked about reasons the patient may want to change the behavior and their current desire to change. Patient left after group topic was introduced and did not return.   Therapeutic Modalities:   Cognitive Behavioral Therapy Person-Centered Therapy Motivational Interviewing   Carney Bernatherine C Leeum Sankey, LCSW

## 2015-01-19 NOTE — ED Notes (Signed)
Pt. Noted sleeping in room. No complaints or concerns voiced. No distress or abnormal behavior noted. Will continue to monitor with security cameras. Q 15 minute rounds continue. 

## 2015-01-19 NOTE — [facility] Suicide Risk Assessment (Signed)
[facility]   Nursing information obtained from:    Demographic factors:    Current Mental Status:    Loss Factors:    Historical Factors:    Risk Reduction Factors:    Total Time spent with patient: 45 minutes Principal Problem: Generalized anxiety disorder Diagnosis:   Patient Active Problem List   Diagnosis Date Noted  . Generalized anxiety disorder [F41.1] 01/19/2015  . Cannabis abuse [F12.10] 01/19/2015  . Mood disorder [F39] 01/18/2015     Continued Clinical Symptoms:  Alcohol Use Disorder Identification Test Final Score (AUDIT): 4 The "Alcohol Use Disorders Identification Test", Guidelines for Use in Primary Care, Second Edition.  World Science writerHealth Organization [facility](WHO). Score between 0-7:  no or low risk or alcohol related problems. Score between 8-15:  moderate risk of alcohol related problems. Score between 16-19:  high risk of alcohol related problems. Score 20 or above:  warrants further diagnostic evaluation for alcohol dependence and treatment.   CLINICAL FACTORS:   Severe Anxiety and/or Agitation Alcohol/Substance Abuse/Dependencies Psychiatric Specialty Exam: Physical Exam  ROS  Blood pressure 136/93, pulse 84, temperature 98.5 F (36.9 C), temperature source Oral, resp. rate 17, height 5\' 7"  (1.702 m), weight 133.811 kg (295 lb).Body mass index is 46.19 kg/(m^2).   COGNITIVE FEATURES THAT CONTRIBUTE TO RISK:  Closed-mindedness, Polarized thinking and Thought constriction (tunnel vision)    SUICIDE RISK:   Mild:  Suicidal ideation of limited frequency, intensity, duration, and specificity.  There are no identifiable plans, no associated intent, mild dysphoria and related symptoms, good self-control (both objective and subjective assessment), few other risk factors, and identifiable protective factors, including available and accessible social support.  PLAN OF CARE: Supportive approach/coping skills                               Anxiety;  work on stress management/coping skills                               Anger; work on anger management                               ADHD; reassess for the need of a stimulant                               Cannabis abuse; motivational interviewing  Medical Decision Making:  Review of Psycho-Social Stressors (1), Review or order clinical lab tests (1), Review of Medication Regimen & Side Effects (2) and Review of New Medication or Change in Dosage (2)  I certify that inpatient services furnished can reasonably be expected to improve the patient's condition.   Pa Tennant A 01/19/2015, 6:36 PM
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
## 2015-01-19 NOTE — ED Notes (Signed)
[employer] officers and Security in to help administer IM medication.

## 2015-01-19 NOTE — Progress Notes (Signed)
D:  Patient denied SI and HI, contracts for safety.   Denied A/V hallucinations.  Denied pain. A:  Medications administered per MD orders.  Emotional support and encouragement given patient. R:  Safety maintained with 15 minute checks. Patient stated nicotine patch made his arm itch.  New order for nicotine gum placed.

## 2015-01-19 NOTE — Tx Team (Signed)
Initial Interdisciplinary Treatment Plan   PATIENT STRESSORS: Marital or family conflict   PATIENT STRENGTHS: Ability for insight Average or above average intelligence Capable of independent living General fund of knowledge Supportive family/friends   PROBLEM LIST: Problem List/Patient Goals Date to be addressed Date deferred Reason deferred Estimated date of resolution  Depression 01/19/15     Previous suicide attempt 01/19/15     "I don't think I need to be here" 01/19/15                                          DISCHARGE CRITERIA:  Ability to meet basic life and health needs Improved stabilization in mood, thinking, and/or behavior Verbal commitment to aftercare and medication compliance  PRELIMINARY DISCHARGE PLAN: Attend aftercare/continuing care group Return to previous living arrangement  PATIENT/FAMIILY INVOLVEMENT: This treatment plan has been presented to and reviewed with the patient, Mason Lopez, and/or family member, .  The patient and family have been given the opportunity to ask questions and make suggestions.  Mason Lopez, [place] 01/19/2015, 1:30 AM

## 2015-01-19 NOTE — Progress Notes (Signed)
Adult Psychoeducational Group Note  Date:  01/19/2015 Time:  9:50 PM  Group Topic/Focus:  Wrap-Up Group:   The focus of this group is to help patients review their daily goal of treatment and discuss progress on daily workbooks.  Participation Level:  Active  Participation Quality:  Appropriate  Affect:  Appropriate  Cognitive:  Alert  Insight: Appropriate  Engagement in Group:  Engaged  Modes of Intervention:  Discussion  Additional Comments:  Pt rated his day "1". He stated that he felt really good because she didn't have to deal with drama today.   Kaleen OdeaCOOKE, Krosby Ritchie R 01/19/2015, 9:50 PM
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
## 2015-01-19 NOTE — Progress Notes (Signed)
D: Patient in room with mother visiting on approach. Pt reports he feels better and that this admission was an impulsive event. Pt reports he has never been prescribed/ taking any medication for his mood. Pt reports he wants to learn coping skills to use after discharge. Pt mood appeared anxious.  A: Met with pt 1:1. Support and encouragement provider to attend groups and engage in milieu. Pt encouraged to discuss feelings and come to staff with any question or concerns.  R: Patient remains safe.

## 2015-01-19 NOTE — [facility] Group Notes (Signed)
The focus of this group is to educate the patient on the purpose and policies of crisis stabilization and provide a format to answer questions about their admission.  The group details unit policies and expectations of patients while admitted.  Patient attended 0900 nurse education orientation group this morning.  Patient actively participated, appropriate affect.  Patient alert, appropriate insight and engagement.  Today patient will work on 3 goals for discharge.  

## 2015-01-20 DIAGNOSIS — R45851 Suicidal ideations: Secondary | ICD-10-CM

## 2015-01-20 DIAGNOSIS — F332 Major depressive disorder, recurrent severe without psychotic features: Secondary | ICD-10-CM | POA: Diagnosis present

## 2015-01-20 MED ORDER — NAPHAZOLINE HCL 0.1 % OP SOLN
1.0000 [drp] | Freq: Four times a day (QID) | OPHTHALMIC | Status: DC | PRN
  Administered 2015-01-20 – 2015-01-21 (×2): 1 [drp] via OPHTHALMIC
  Filled 2015-01-20: qty 15

## 2015-01-20 MED ORDER — ESCITALOPRAM OXALATE 10 MG PO TABS
10.0000 mg | ORAL_TABLET | Freq: Every day | ORAL | Status: DC
  Administered 2015-01-20 – 2015-01-23 (×4): 10 mg via ORAL
  Filled 2015-01-20 (×6): qty 1

## 2015-01-20 MED ORDER — MONTELUKAST SODIUM 10 MG PO TABS
10.0000 mg | ORAL_TABLET | Freq: Every day | ORAL | Status: DC
  Administered 2015-01-20 – 2015-01-22 (×3): 10 mg via ORAL
  Filled 2015-01-20 (×5): qty 1

## 2015-01-20 MED ORDER — ESCITALOPRAM OXALATE 10 MG PO TABS: 10.0000 mg | ORAL_TABLET | Freq: Every day | ORAL | Status: DC

## 2015-01-20 MED ORDER — HYDROXYZINE HCL 25 MG PO TABS: 25.0000 mg | ORAL_TABLET | Freq: Four times a day (QID) | ORAL | Status: DC | PRN
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
## 2015-01-20 NOTE — Plan of Care (Signed)
Problem: Diagnosis: Increased Risk For Suicide Attempt Goal: STG-Patient Will Report Suicidal Feelings to Staff Outcome: Progressing Pt has had bright affect, reports mood improving and interactive on the unit. Pt denies any suicidal ideation.

## 2015-01-20 NOTE — Plan of Care (Signed)
Problem: Diagnosis: Increased Risk For Suicide Attempt Goal: STG-Patient Will Report Suicidal Feelings to Staff Outcome: Progressing Pt is safe and denies SI     

## 2015-01-20 NOTE — [facility] Group Notes (Signed)
[facility] Group Notes:  (Nursing/MHT/Case Management/Adjunct)  Date:  01/20/2015  Time:  12:34 PM  Type of Therapy:  Nurse Education  Participation Level:  Active  Participation Quality:  Attentive  Affect:  Anxious  Cognitive:  Appropriate  Insight:  Improving  Engagement in Group:  Engaged  Modes of Intervention:  Discussion and Education  Summary of Progress/Problems: The purpose of this group is to discuss the topic of the day which is healthy support systems. Patient attended group and was engaged. He reported his goal today is to, "be a better me."   Marzetta BoardDopson, Yolanda Dockendorf E 01/20/2015, 12:34 PM

## 2015-01-20 NOTE — [facility] Group Notes (Signed)
[facility] LCSW Group Therapy  01/20/2015 11:15 AM  Type of Therapy:  Group Therapy  Participation Level:  Active  Participation Quality:  Appropriate  Affect:  Appropriate  Cognitive:  Appropriate  Insight:  Developing/Improving  Engagement in Therapy:  Engaged  Modes of Intervention:  Clarification, Discussion, Exploration, Socialization and Support  Summary of Progress/Problems: Topic for today was thoughts and feelings regarding discharge. We discussed fears of upcoming changes including judegements, expectations and stigma of mental health issues. We then discussed supports: what constitutes a supportive framework, identification of supports and what to do when others are not supportive. Patient's then identified a specific coping tool to use when others are not available.  Patient shared that he is looking forward to his upcoming birthday and increasing the serenity he experiences in his life. Patient shared that he knows he is responsible for creating time to center himself and take care of his needs. Pt described group of 4 core supports outside the home he can rely on. He admits to using food and shopping as coping tools.   Carney Bernatherine C Isla Sabree, LCSW
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
## 2015-01-20 NOTE — [facility] Counselor (Signed)
Mother Soundra PilonCatherine Jones at [phone] reported during Suicide Prevention Education that patient did better when in school and receiving medication for ADD: reports ongoing anger issues for past 10 years. Writer put 'sticky notes' on EPIC chart at 4:01 PM on 01/20/2015 to same effect.  Is willing for patient to return to her home as he has no where else to go. Carney Bernatherine C Penne Rosenstock, LCSW

## 2015-01-20 NOTE — [facility] Suicide Risk Assessment (Signed)
[facility] INPATIENT:  Family/Significant Other Suicide Prevention Education  Suicide Prevention Education:  Education Completed; Soundra PilonCatherine Jones (Mother) at [phone] has been identified by the patient as the family member/significant other with whom the patient will be residing, and identified as the person(s) who will aid the patient in the event of a mental health crisis (suicidal ideations/suicide attempt).  With written consent from the patient, the family member/significant other has been provided the following suicide prevention education, prior to the and/or following the discharge of the patient.  The suicide prevention education provided includes the following:  Suicide risk factors  Suicide prevention and interventions  National Suicide Hotline telephone number  [facility] assessment telephone number  [facility] Assistance 911  [facility] and/or Residential Mobile Crisis Unit telephone number  Request made of family/significant other to:  Remove weapons (e.g., guns, rifles, knives), all items previously/currently identified as safety concern.    Remove drugs/medications (over-the-counter, prescriptions, illicit drugs), all items previously/currently identified as a safety concern.  The family member/significant other verbalizes understanding of the suicide prevention education information provided.  The family member/significant other agrees to remove the items of safety concern listed above.  Clide DalesHarrill, Kikue Gerhart Campbell 01/20/2015, 3:57 PM
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
## 2015-01-20 NOTE — Progress Notes (Signed)
Pt pleasant and cooperative, mood presents as euthymic. Mason Lopez states '' i was feeling stressed and I'm doing a lot better now. I really don't have any complaints this morning other than my allergies are making my throat a little hoarse. '' patient states he feels medications helpful and denies any SI/HI.A.V hallucinations. Pt visible in the milieu, and interactive during group. States he plans to '' find better ways to cope. '' pt completed self inventory and rates depression and anxiety at 0/10 on scale, 10 being worst. He reports his goal is to ''be a better whole me '' Pt denies any further acute concerns at this time. Pt is safe, will continue to monitor q 15 minutes for safety.

## 2015-01-20 NOTE — Progress Notes (Signed)
Adult Psychoeducational Group Note  Date:  01/20/2015 Time:  2000  Group Topic/Focus:  Wrap-Up Group:   The focus of this group is to help patients review their daily goal of treatment and discuss progress on daily workbooks.  Participation Level:  Active  Participation Quality:  Appropriate  Affect:  Appropriate  Cognitive:  Appropriate  Insight: Appropriate  Engagement in Group:  Improving  Modes of Intervention:  Support  Additional Comments:  PATIENT STATED HE HAD A GOOD DAY.    Estevan OaksWhitaker, Thai Hemrick Shaunte 01/20/2015, 9:11 PM

## 2015-01-21 DIAGNOSIS — G4700 Insomnia, unspecified: Secondary | ICD-10-CM

## 2015-01-21 DIAGNOSIS — F1210 Cannabis abuse, uncomplicated: Secondary | ICD-10-CM

## 2015-01-21 DIAGNOSIS — F329 Major depressive disorder, single episode, unspecified: Secondary | ICD-10-CM

## 2015-01-21 NOTE — Plan of Care (Signed)
Problem: Consults Goal: Depression Patient Education See Patient Education Module for education specifics.  Outcome: Completed/Met Date Met:  01/21/15 Nurse discussed depression/coping skills with patient.        

## 2015-01-21 NOTE — Progress Notes (Signed)
D:  Patient's self inventory sheet, patient has fair sleep, no sleep medication administered.  Good appetite, normal energy level, good concentration.  Denied anxiety, depression and hopeless.  Denied withdrawals.  Denied SI.  Denied physical problems.  Denied pain.  Goal is to work on discharge.  Does have discharge plans.  A:  Medications administered per MD orders.  Emotional support and encouragement given patient. R:  Denied SI and HI, contracts for safety.   Denied A/V hallucinations.  Safety maintained with 15 minute checks.
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
## 2015-01-21 NOTE — [facility] Group Notes (Signed)
   [facility]  01/21/2015  8:45 AM   Participation Quality: Alert, Appropriate and Oriented  Mood/Affect: Appropriate  Depression Rating: 1  Anxiety Rating: 1  Thoughts of Suicide: Pt denies SI/HI  Will you contract for safety? Yes  Current AVH: Pt denies  Plan for Discharge/Comments: Pt attended discharge planning group and actively participated in group. CSW provided pt with today's workbook. Patient reports that he can return home and would like a referral for outpatient services.  Transportation Means: Pt reports access to transportation  Supports: No supports mentioned at this time  Samuella BruinKristin Haniyah Maciolek, MSW, [employer] IncLCSWA Clinical Social Worker [facility] [phone]

## 2015-01-22 DIAGNOSIS — F411 Generalized anxiety disorder: Secondary | ICD-10-CM

## 2015-01-22 DIAGNOSIS — F332 Major depressive disorder, recurrent severe without psychotic features: Principal | ICD-10-CM

## 2015-01-22 NOTE — Progress Notes (Signed)
Adult Psychoeducational Group Note  Date:  01/22/2015 Time:  11:06 AM  Group Topic/Focus:  Recovery Goals:   The focus of this group is to identify appropriate goals for recovery and establish a plan to achieve them.  Participation Level:  Active  Participation Quality:  Appropriate  Affect:  Appropriate  Cognitive:  Appropriate  Insight: Good  Engagement in Group:  Engaged  Modes of Intervention:  Discussion   Additional Comments:  Pt attended recovery group this morning. Pt participate in group. Pt was able to share in group.    [name] A 01/22/2015, 11:06 AM
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
## 2015-01-22 NOTE — [facility] Group Notes (Signed)
The focus of this group is to educate the patient on the purpose and policies of crisis stabilization and provide a format to answer questions about their admission.  The group details unit policies and expectations of patients while admitted.  Patient attended 0900 nurse education orientation group this morning.  Patient actively participated, appropriate affect.  Patient was alert, appropriate insight and engagement.  Today patient will work on 3 goals for discharge.  

## 2015-01-22 NOTE — Progress Notes (Signed)
D:  Patient's self inventory sheet, patient slept good last night, sleep medication helpful.  Good appetite, normal energy level, good concentration.  Denied depression, hopeless, and anxiety.  Denied withdrawals.  Denied physical problems.  Denied pain.  Goal is to work on discharge.  Plans to talk to MD.  Does have discharge plans. A:  Medications administered per MD orders.  Emotional support and encouragement given patient. R:  Denied SI and HI, contracts for safety.  Denied A/V hallucinations.  Safety maintained with 15 minute checks

## 2015-01-22 NOTE — Progress Notes (Signed)
D: Pt how is alert and oriented x 4 also denies any form of anxiety, depression, SI/HI; he states, "I feel real good.  This is the best I have been in a while-I think the meds am getting are really working."  Pt was seen playing games and having meaningful conversations with peers and staffs.  A: Medications administered as prescribed.  Support, encouragement, and safe environment provided.  15-minute safety checks continue. R: Pt was med compliant.  Safety checks continue.

## 2015-01-22 NOTE — Progress Notes (Signed)
Adult Psychoeducational Group Note  Date:  01/22/2015 Time:  10:23 PM  Group Topic/Focus:  Wrap-Up Group:   The focus of this group is to help patients review their daily goal of treatment and discuss progress on daily workbooks.  Participation Level:  Active  Participation Quality:  Attentive  Affect:  Appropriate  Cognitive:  Appropriate  Insight: Limited  Engagement in Group:  Engaged  Modes of Intervention:  Support  Additional Comments:  Patient stated he had good day and rated his day a 9. He also stated he was still working on a few more goals but was not specific about those goals yet.  Natasha MeadKiara M Shalice Woodring 01/22/2015, 10:23 PM

## 2015-01-22 NOTE — [facility] Group Notes (Signed)
[facility] LCSW Group Therapy  01/22/2015   1:15 PM   Type of Therapy:  Group Therapy  Participation Level:  Active  Participation Quality:  Attentive, Sharing and Supportive  Affect:  Appropriate  Cognitive:  Alert and Oriented  Insight:  Developing/Improving and Engaged  Engagement in Therapy:  Developing/Improving and Engaged  Modes of Intervention:  Clarification, Confrontation, Discussion, Education, Exploration, Limit-setting, Orientation, Problem-solving, Rapport Building, Dance movement psychotherapisteality Testing, Socialization and Support  Summary of Progress/Problems: The topic for group therapy was feelings about diagnosis.  Pt actively participated in group discussion on their past and current diagnosis and how they feel towards this.  Pt also identified how society and family members judge them, based on their diagnosis as well as stereotypes and stigmas. Patient discussed feeling judged by society and family due to stigmas surrounding mental illness. Patient shared that he feels that others believe that he has a moral defect due to his mental illness instead of understanding it as a biological illness. Patient shared that his father often invalidates his experience of depression and anxiety. CSW and other group members provided patient with emotional support and encouragement.  Samuella BruinKristin Herschel Fleagle, MSW, [employer]CSWA Clinical Social Worker [facility] [phone]
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
## 2015-01-22 NOTE — Progress Notes (Signed)
Recreation Therapy Notes  Animal-Assisted Activity (AAA) Program Checklist/Progress Notes Patient Eligibility Criteria Checklist & Daily Group note for Rec Tx Intervention  Date: 07.05.16 Time: 230 pm Location: [address] Dayroom   AAA/T Program Assumption of Risk Form signed by Patient/ or Parent Legal Guardian yes  Patient is free of allergies or sever asthma yes  Patient reports no fear of animals yes  Patient reports no history of cruelty to animalsyes  Patient understands his/her participation is voluntary yes  Patient washes hands before animal contact yes  Patient washes hands after animal contact yes  Behavioral Response:  Engaged  Education: Charity fundraiserHand Washing, Appropriate Animal Interaction   Education Outcome: Acknowledges understanding/In group clarification offered/Needs additional education.   Clinical Observations/Feedback: Patient attended group.   Mason RancherMarjette Vergene Lopez, Mason Lopez         Mason RancherLindsay, Mason Lopez 01/22/2015 4:49 PM

## 2015-01-23 MED ORDER — ESCITALOPRAM OXALATE 10 MG PO TABS: 10.0000 mg | ORAL_TABLET | Freq: Every day | ORAL | Status: DC

## 2015-01-23 MED ORDER — MONTELUKAST SODIUM 10 MG PO TABS: 10.0000 mg | ORAL_TABLET | Freq: Every day | ORAL | Status: DC

## 2015-01-23 NOTE — Progress Notes (Addendum)
Pt appears very bright this am. He does contract for safety and denies Si and HI. Pt stated he is excited that he has a job with [employer] which starts on July 27th. Pt stated he feels much better about himself and realizes for now he needs to live with his mom to save money. He has been in the dayroom with the other pts. Pt stated he will plan to purchase puzzles and this will help him with his stress level. He rated his depression and anxiety a 0/0 today. 1p-Pt wil be discharged today and appears very excited. Pt was discharged by nurse Rosanne AshingJim. He was given all his discharge instructions and RX and samples. Pt was also given back all of his belongings from his locker. He does contract for safety and denies Si and HI.

## 2015-01-23 NOTE — [facility] Group Notes (Signed)
   [facility]  01/23/2015  8:45 AM   Participation Quality: Alert, Appropriate and Oriented  Mood/Affect: Appropriate  Depression Rating: 1  Anxiety Rating: 1  Thoughts of Suicide: Pt denies SI/HI  Will you contract for safety? Yes  Current AVH: Pt denies  Plan for Discharge/Comments: Pt attended discharge planning group and actively participated in group. CSW provided pt with today's workbook. Patient reports feeling ready to discharge home today to follow up with [facility].  Transportation Means: Pt reports access to transportation  Supports: No supports mentioned at this time  Samuella BruinKristin Daneille Desilva, MSW, [employer]CSWA Clinical Social Worker [facility] [phone]

## 2015-01-23 NOTE — Discharge Summary (Signed)
Physician Discharge Summary Note  Patient:  Mason Lopez is an 29 y.o., male MRN:  [id number] DOB:  October 26, 1985 Patient phone:  [phone] (home)  Patient address:   [address] [place] [address],  Total Time spent with patient: 45 minutes  Date of Admission:  01/19/2015 Date of Discharge: 01/23/2015  Reason for Admission:  anxiety  Principal Problem: Generalized anxiety disorder Discharge Diagnoses: Patient Active Problem List   Diagnosis Date Noted  . MDD (major depressive disorder), recurrent severe, without psychosis [F33.2]   . Generalized anxiety disorder [F41.1] 01/19/2015  . Cannabis abuse [F12.10] 01/19/2015  . Mood disorder [F39] 01/18/2015    Musculoskeletal: Strength & Muscle Tone: within normal limits Gait & Station: normal Patient leans: N/A  Psychiatric Specialty Exam:  SEE SRA Physical Exam  Vitals reviewed. Psychiatric: His mood appears anxious. He does not exhibit a depressed mood.    Review of Systems  Constitutional: Negative for fever.  Eyes: Negative for blurred vision.  Respiratory: Negative for cough.   Cardiovascular: Negative for chest pain.  Gastrointestinal: Negative for heartburn.  Genitourinary: Negative for dysuria.  Skin: Negative for rash.  Neurological: Negative for headaches.  Psychiatric/Behavioral: Negative for hallucinations and substance abuse. The patient is not nervous/anxious.     Blood pressure 116/73, pulse 75, temperature 97.9 F (36.6 C), temperature source Oral, resp. rate 20, height 5\' 7"  (1.702 m), weight 133.811 kg (295 lb).Body mass index is 46.19 kg/(m^2).  Have you used any form of tobacco in the last 30 days? (Cigarettes, Smokeless Tobacco, Cigars, and/or Pipes): Yes  Has this patient used any form of tobacco in the last 30 days? (Cigarettes, Smokeless Tobacco, Cigars, and/or Pipes) N/A  Past Medical History:  Past Medical History  Diagnosis Date  . ADHD (attention deficit hyperactivity disorder)   .  Seasonal allergies   . Asthma    History reviewed. No pertinent past surgical history. Family History: History reviewed. No pertinent family history. Social History:  History  Alcohol Use  . Yes    Comment: occasional     History  Drug Use  . Yes  . Special: Marijuana    History   Social History  . Marital Status: Single    Spouse Name: N/A  . Number of Children: N/A  . Years of Education: N/A   Social History Main Topics  . Smoking status: Current Every Day Smoker -- 0.50 packs/day  . Smokeless tobacco: Never Used  . Alcohol Use: Yes     Comment: occasional  . Drug Use: Yes    Special: Marijuana  . Sexual Activity: Not on file   Other Topics Concern  . None   Social History Narrative   Risk to Self: Is patient at risk for suicide?: Yes What has been your use of drugs/alcohol within the last 12 months?: THC 1+ Blunt 4-7 days per week; Alcohol 1 beer 3-4 weekly Risk to Others:   Prior Inpatient Therapy:   Prior Outpatient Therapy:    Level of Care:  OP  Hospital Course:  Mason Lopez was admitted for Generalized anxiety disorder and crisis management.  Hwas treated discharged with the medications listed below under Medication List.  Medical problems were identified and treated as needed.  Home medications were restarted as appropriate.  Improvement was monitored by observation and Mason Lopez daily report of symptom reduction.  Emotional and mental status was monitored by daily self-inventory reports completed by Mason Lopez and clinical staff.  Mason Lopez was evaluated by the treatment team for stability and plans for continued recovery upon discharge.  Mason Lopez motivation was an integral factor for scheduling further treatment.  Employment, transportation, bed availability, health status, family support, and any pending legal issues were also considered during his hospital stay.  He was offered further treatment options upon discharge  including but not limited to Residential, Intensive Outpatient, and Outpatient treatment.  Mason Lopez will follow up with the services as listed below under Follow Up Information.     Upon completion of this admission the patient was both mentally and medically stable for discharge denying suicidal/homicidal ideation, auditory/visual/tactile hallucinations, delusional thoughts and paranoia.      Consults:  psychiatry  Significant Diagnostic Studies:  labs: per ED  Discharge Vitals:   Blood pressure 116/73, pulse 75, temperature 97.9 F (36.6 C), temperature source Oral, resp. rate 20, height 5\' 7"  (1.702 m), weight 133.811 kg (295 lb). Body mass index is 46.19 kg/(m^2). Lab Results:   No results found for this or any previous visit (from the past 72 hour(s)).  Physical Findings: AIMS: Facial and Oral Movements Muscles of Facial Expression: None, normal Lips and Perioral Area: None, normal Jaw: None, normal Tongue: None, normal,Extremity Movements Upper (arms, wrists, hands, fingers): None, normal Lower (legs, knees, ankles, toes): None, normal, Trunk Movements Neck, shoulders, hips: None, normal, Overall Severity Severity of abnormal movements (highest score from questions above): None, normal Incapacitation due to abnormal movements: None, normal Patient's awareness of abnormal movements (rate only patient's report): No Awareness, Dental Status Current problems with teeth and/or dentures?: No Does patient usually wear dentures?: No  CIWA:  CIWA-Ar Total: 1 COWS:  COWS Total Score: 2   See Psychiatric Specialty Exam and Suicide Risk Assessment completed by Attending Physician prior to discharge.  Discharge destination:  Home  Is patient on multiple antipsychotic therapies at discharge:  No   Has Patient had three or more failed trials of antipsychotic monotherapy by history:  No    Recommended Plan for Multiple Antipsychotic Therapies: NA     Medication List     STOP taking these medications        amoxicillin 500 MG capsule  Commonly known as:  AMOXIL     cetirizine 10 MG tablet  Commonly known as:  ZYRTEC     HYDROcodone-acetaminophen 5-325 MG per tablet  Commonly known as:  NORCO/VICODIN     multivitamin with minerals Tabs tablet      TAKE these medications      Indication   escitalopram 10 MG tablet  Commonly known as:  LEXAPRO  Take 1 tablet (10 mg total) by mouth daily.   Indication:  Depression     montelukast 10 MG tablet  Commonly known as:  SINGULAIR  Take 1 tablet (10 mg total) by mouth at bedtime.   Indication:  Asthma, Hayfever       Follow-up Information    Follow up with [facility].   Specialty:  Behavioral Health   Why:  Walk-in clinic Monday-Friday between 8am to 3pm for assessment for therapy and medication management services.   [address] [place] [address] [phone]       Follow-up recommendations:  Activity:  as tol, diet as tol  Comments:  1.  Take all your medications as prescribed.              2.  Report any adverse side effects to outpatient provider.  3.  Patient instructed to not use alcohol or illegal drugs while on prescription medicines.            4.  In the event of worsening symptoms, instructed patient to call 911, the crisis hotline or go to nearest emergency room for evaluation of symptoms.  Total Discharge Time: 30 min  Signed: Velna Hatchet May Agustin AGNP-BC 01/23/2015, 7:35 PM  I personally assessed the patient and formulated the plan Madie Reno A. Dub Mikes, M.D.

## 2015-01-23 NOTE — BHH Suicide Risk Assessment (Addendum)
[facility]   Demographic Factors:  29 year old single male, lives with mother   Total Time spent with patient: 30 minutes  Musculoskeletal: Strength & Muscle Tone: within normal limits Gait & Station: normal Patient leans: N/A  Psychiatric Specialty Exam: Physical Exam  ROS  Blood pressure 116/73, pulse 75, temperature 97.9 F (36.6 C), temperature source Oral, resp. rate 20, height 5\' 7"  (1.702 m), weight 295 lb (133.811 kg).Body mass index is 46.19 kg/(m^2).  General Appearance: improved mood  Eye Contact::  Good  Speech:  Normal Rate409  Volume:  Normal  Mood:  denies depression, states mood " normal"  Affect:  Appropriate  Thought Process:  Goal Directed and Linear  Orientation:  Full (Time, Place, and Person)  Thought Content:  denies hallucinations, no delusions, not internally preoccupied   Suicidal Thoughts:  No- denies any thoughts of hurting self or anyone else   Homicidal Thoughts:  No  Memory:  recent and remote grossly intact   Judgement:  Other:  improved   Insight:  improved   Psychomotor Activity:  Normal  Concentration:  Good  Recall:  Good  Fund of Knowledge:Good  Language: NA  Akathisia:  Negative  Handed:  Right  AIMS (if indicated):     Assets:  Communication Skills Desire for Improvement Resilience Social Support  Sleep:  Number of Hours: 6.75  Cognition: WNL  ADL's:   Improved    Have you used any form of tobacco in the last 30 days? (Cigarettes, Smokeless Tobacco, Cigars, and/or Pipes): Yes  Has this patient used any form of tobacco in the last 30 days? (Cigarettes, Smokeless Tobacco, Cigars, and/or Pipes) Yes, A prescription for an FDA-approved tobacco cessation medication was offered at discharge and the patient refused  Mental Status Per Nursing Assessment::   On Admission:     Current Mental Status by Physician: At this time patient is improved, and at present is euthymic, with appropriate range of affect,  no thought disorder, no SI or HI, no psychotic symptoms.  Loss Factors: Financial difficulties , recent job loss  Historical Factors: This is first psychiatric admission, no prior suicidal attempts   Risk Reduction Factors:   Sense of responsibility to family, Living with another person, especially a relative and Positive coping skills or problem solving skills  Continued Clinical Symptoms:  As noted, at this time patient is much improved, currently euthymic, with appropriate range of affect, no SI or HI, behavior on unit in good control. At this time he is future oriented, and states he has a new job , which he starts later this month With patient's consent I spoke with mother on the phone who corroborates that he is doing better and agrees to discharge- she will pick him up later today.  Cognitive Features That Contribute To Risk:  No gross cognitive deficits noted upon discharge. Is alert , attentive, and oriented x 3   Suicide Risk:  Mild:  Suicidal ideation of limited frequency, intensity, duration, and specificity.  There are no identifiable plans, no associated intent, mild dysphoria and related symptoms, good self-control (both objective and subjective assessment), few other risk factors, and identifiable protective factors, including available and accessible social support.  Principal Problem: Generalized anxiety disorder Discharge Diagnoses:  Patient Active Problem List   Diagnosis Date Noted  . MDD (major depressive disorder), recurrent severe, without psychosis [F33.2]   . Generalized anxiety disorder [F41.1] 01/19/2015  . Cannabis abuse [F12.10] 01/19/2015  . Mood disorder [F39]  01/18/2015    Follow-up Information    Follow up with [facility].   Specialty:  Behavioral Health   Why:  Walk-in clinic Monday-Friday between 8am to 3pm for assessment for therapy and medication management services.   Contact information:   [address] [place] [address] [phone]        Plan Of Care/Follow-up recommendations:  Activity:  as tolerated Diet:  Regular - allergic to peanuts  Tests:  NA Other:  See below  Is patient on multiple antipsychotic therapies at discharge:  No   Has Patient had three or more failed trials of antipsychotic monotherapy by history:  No  Recommended Plan for Multiple Antipsychotic Therapies: NA  Patient is leaving unit in good spirits.  Mother is going to pick him up later today.  Follow up as above   COBOS, FERNANDO 01/23/2015, 9:45 AM

## 2015-01-23 NOTE — Progress Notes (Signed)
D: Pt completely denies any form of depression and anxiety.  Pt also denies SI/HI, AVH and pain.  Pt states that this is the best he has felt in a longtime.  Pt is also excited about a new job that he would be starting on July 27; he states, "I so excited about that new job I would be starting on the 27th."  Pt continues to be alert and oriented x 4. A: Medications administered as prescribed.  Support, encouragement, and safe environment provided.  15-minute safety checks continue. R: Pt was med compliant.  Will continue to monitor Pt for safety.

## 2015-01-23 NOTE — Progress Notes (Signed)
  [facility] :  Will you be returning to the same living situation after discharge:  Yes,  Patient plans to return home with his mother At discharge, do you have transportation home?: Yes,  patient reports access to transportation Do you have the ability to pay for your medications: Yes,  patient will be provided with medication samples and prescriptions at discharge  Release of information consent forms completed and in the chart;  Patient's signature needed at discharge.  Patient to Follow up at: Follow-up Information    [facility]   Specialty:  Behavioral Health   Why:  Walk-in clinic Monday-Friday between 8am to 3pm for assessment for therapy and medication management services.   Contact information:   [address] [place] [address] [phone]       Patient denies SI/HI: Yes,  denies    Safety Planning and Suicide Prevention discussed: Yes,  with patient and mother  Have you used any form of tobacco in the last 30 days? (Cigarettes, Smokeless Tobacco, Cigars, and/or Pipes): Yes  Has patient been referred to the Quitline?: Patient refused referral  Mason Lopez, [place] CarboKristin L 01/23/2015, 10:45 AM

## 2015-04-23 ENCOUNTER — Ambulatory Visit (INDEPENDENT_AMBULATORY_CARE_PROVIDER_SITE_OTHER): Payer: Self-pay | Admitting: Internal Medicine

## 2015-04-23 VITALS — BP 114/74 | HR 75 | Temp 98.3°F | Resp 18 | Ht 68.0 in | Wt 295.2 lb

## 2015-04-23 DIAGNOSIS — J0101 Acute recurrent maxillary sinusitis: Secondary | ICD-10-CM

## 2015-04-23 DIAGNOSIS — Z6841 Body Mass Index (BMI) 40.0 and over, adult: Secondary | ICD-10-CM | POA: Insufficient documentation

## 2015-04-23 MED ORDER — AMOXICILLIN 875 MG PO TABS: 875.0000 mg | ORAL_TABLET | Freq: Two times a day (BID) | ORAL | Status: DC

## 2015-04-23 MED ORDER — PREDNISONE 20 MG PO TABS: ORAL_TABLET | ORAL | Status: DC

## 2015-04-23 NOTE — Progress Notes (Signed)
Subjective:  This chart was scribed for Ellamae Sia, MD by Broadus John, Medical Scribe. This patient was seen in Room 11 and the patient's care was started at 6:30 PM.   Patient ID: Mason Lopez, male    DOB: 1986/01/03, 29 y.o.   MRN: [id number]  Chief Complaint  Patient presents with  . Sinus Pressure    X 3 weeks, worsened this week  . Sore Throat    X 3 weeks   HPI HPI Comments: Mason Lopez is a 29 y.o. male who presents to [facility] complaining of possible sinus infection, onset two weeks ago.  Pt reports he has a history of the same symptoms, with left eat pain, in April for which he was treated for, and now he notes that the symptoms have returned to his right ear. Pt reports associated symptoms trouble breathing, severe cough, ear pressure, congestion, headaches that have now resolved, sore throat, and congestion. He denies trouble hearing, allergy symptoms such as sneezing, trouble swallowing. Pt indicates that he works at [employer], so he could have possibly been in a sick environment.      Pt does not take any medications regularly other than Zyrtec.    Patient Active Problem List   Diagnosis Date Noted  . MDD (major depressive disorder), recurrent severe, without psychosis (HCC)   . Generalized anxiety disorder 01/19/2015  . Cannabis abuse 01/19/2015  . Mood disorder (HCC) 01/18/2015   Past Medical History  Diagnosis Date  . ADHD (attention deficit hyperactivity disorder)   . Seasonal allergies   . Asthma    History reviewed. No pertinent past surgical history. Allergies  Allergen Reactions  . Peanuts [Peanut Oil] Swelling   Prior to Admission medications   Medication Sig Start Date End Date Taking? Authorizing Provider  cetirizine (ZYRTEC) 10 MG tablet Take 10 mg by mouth daily.   Yes Historical Provider, MD  amoxicillin (AMOXIL) 875 MG tablet Take 1 tablet (875 mg total) by mouth 2 (two) times daily. 04/23/15   Tonye Pearson, MD  escitalopram (LEXAPRO) 10 MG tablet Take 1 tablet (10 mg total) by mouth daily. Patient not taking: Reported on 04/23/2015 01/23/15   Adonis Brook, NP  montelukast (SINGULAIR) 10 MG tablet Take 1 tablet (10 mg total) by mouth at bedtime. Patient not taking: Reported on 04/23/2015 01/23/15   Adonis Brook, NP  predniSONE (DELTASONE) 20 MG tablet 3/3/2/2/1/1 single daily dose for 6 days 04/23/15   Tonye Pearson, MD   Social History   Social History  . Marital Status: Single    Spouse Name: N/A  . Number of Children: N/A  . Years of Education: N/A   Occupational History  . Not on file.   Social History Main Topics  . Smoking status: Current Every Day Smoker -- 0.50 packs/day  . Smokeless tobacco: Never Used  . Alcohol Use: Yes     Comment: occasional  . Drug Use: Yes    Special: Marijuana  . Sexual Activity: Not on file   Other Topics Concern  . Not on file   Social History Narrative     Review of Systems  HENT: Positive for congestion, sinus pressure and sore throat. Negative for sneezing and trouble swallowing.   Respiratory: Positive for cough and shortness of breath.   Neurological: Positive for headaches.       Objective:   Physical Exam  Constitutional: He is oriented to person, place, and time. He  appears well-developed and well-nourished. No distress.  HENT:  Head: Normocephalic and atraumatic.  Right Ear: External ear normal.  Left Ear: External ear normal.  Throat slightly red without exudate, purulent discharge in the nose with swell turbinates.   Eyes: EOM are normal. Pupils are equal, round, and reactive to light.  Neck: Neck supple.  Cardiovascular: Normal rate, regular rhythm and normal heart sounds.   Pulmonary/Chest: Effort normal and breath sounds normal.  Lymphadenopathy:    He has no cervical adenopathy.  Neurological: He is alert and oriented to person, place, and time. No cranial nerve deficit.  Skin: Skin is warm and dry.    Psychiatric: He has a normal mood and affect. His behavior is normal.  Nursing note and vitals reviewed.  BP 114/74 mmHg  Pulse 75  Temp(Src) 98.3 F (36.8 C) (Oral)  Resp 18  Ht  (1.727 m)  Wt 295 lb 3.2 oz (133.902 kg)  BMI 44.90 kg/m2  SpO2 98%     Assessment & Plan:  Acute recurrent maxillary sinusitis  BMI 40.0-44.9, adult (HCC)  Meds ordered this encounter  Medications  . cetirizine (ZYRTEC) 10 MG tablet    Sig: Take 10 mg by mouth daily.  Marland Kitchen amoxicillin (AMOXIL) 875 MG tablet    Sig: Take 1 tablet (875 mg total) by mouth 2 (two) times daily.    Dispense:  20 tablet    Refill:  0  . predniSONE (DELTASONE) 20 MG tablet    Sig: 3/3/2/2/1/1 single daily dose for 6 days    Dispense:  12 tablet    Refill:  0   ?flonase if recurs  By signing my name below, I, Rawaa Al Rifaie, attest that this documentation has been prepared under the direction and in the presence of Ellamae Sia, MD.  Watt Climes Rifaie, Medical Scribe. 04/23/2015.  6:42 PM.  I have completed the patient encounter in its entirety as documented by the scribe, with editing by me where necessary. Hartley Urton P. Merla Riches, M.D.

## 2015-04-23 NOTE — Addendum Note (Signed)
Addended by: Felix Ahmadi A on: 04/23/2015 07:10 PM   Modules accepted: Orders

## 2016-03-16 ENCOUNTER — Emergency Department (HOSPITAL_COMMUNITY)
Admission: EM | Admit: 2016-03-16 | Discharge: 2016-03-16 | Disposition: A | Discharge status: Home / Self Care | Payer: Managed Care, Other (non HMO) | Attending: Emergency Medicine | Admitting: Emergency Medicine

## 2016-03-16 ENCOUNTER — Encounter (HOSPITAL_COMMUNITY): Payer: Self-pay

## 2016-03-16 DIAGNOSIS — F909 Attention-deficit hyperactivity disorder, unspecified type: Secondary | ICD-10-CM | POA: Insufficient documentation

## 2016-03-16 DIAGNOSIS — H6092 Unspecified otitis externa, left ear: Secondary | ICD-10-CM | POA: Insufficient documentation

## 2016-03-16 DIAGNOSIS — Z91010 Allergy to peanuts: Secondary | ICD-10-CM | POA: Insufficient documentation

## 2016-03-16 DIAGNOSIS — F17200 Nicotine dependence, unspecified, uncomplicated: Secondary | ICD-10-CM | POA: Insufficient documentation

## 2016-03-16 DIAGNOSIS — H6122 Impacted cerumen, left ear: Secondary | ICD-10-CM | POA: Insufficient documentation

## 2016-03-16 DIAGNOSIS — J45909 Unspecified asthma, uncomplicated: Secondary | ICD-10-CM | POA: Insufficient documentation

## 2016-03-16 MED ORDER — NEOMYCIN-POLYMYXIN-HC 3.5-10000-1 OT SUSP: 4.0000 [drp] | Freq: Three times a day (TID) | OTIC | 0 refills | Status: DC

## 2016-03-16 MED ORDER — DOCUSATE SODIUM 100 MG PO CAPS
100.0000 mg | ORAL_CAPSULE | Freq: Once | ORAL | Status: AC
  Administered 2016-03-16: 100 mg via ORAL
  Filled 2016-03-16: qty 1

## 2016-03-16 MED ORDER — CARBAMIDE PEROXIDE 6.5 % OT SOLN: 5.0000 [drp] | Freq: Two times a day (BID) | OTIC | 0 refills | Status: DC | PRN

## 2016-03-16 NOTE — Discharge Instructions (Signed)
Read the information below.  You may return to the Emergency Department at any time for worsening condition or any new symptoms that concern you.  If you develop fevers, uncontrolled ear pain, bleeding or discharge from your ear, see your doctor or return for a recheck.    °

## 2016-03-16 NOTE — ED Provider Notes (Signed)
[facility]-EMERGENCY DEPT Provider Note   CSN: [id number] Arrival date & time: 03/16/16  0400     History   Chief Complaint Chief Complaint  Patient presents with  . Otalgia    left    HPI Mason Lopez is a 30 y.o. male.  HPI   Patient presents with left ear pain that has been present x 3-4 days, only at night.  The pain is sharp and aching, gradually worsening, radiates now into the left jaw, neck ,and head.  Notes pressure in the ear.  Occasionally hears ears popping.  Last night took prescription motrin with relief, tonight it did not help.  Denies fevers, nasal congestion, sinus pain, sore throat, dental pain, cough.   Past Medical History:  Diagnosis Date  . ADHD (attention deficit hyperactivity disorder)   . Asthma   . Seasonal allergies     Patient Active Problem List   Diagnosis Date Noted  . BMI 40.0-44.9, adult (HCC) 04/23/2015  . MDD (major depressive disorder), recurrent severe, without psychosis (HCC)   . Generalized anxiety disorder 01/19/2015  . Cannabis abuse 01/19/2015  . Mood disorder (HCC) 01/18/2015    History reviewed. No pertinent surgical history.     Home Medications    Prior to Admission medications   Medication Sig Start Date End Date Taking? Authorizing Provider  cetirizine (ZYRTEC) 10 MG tablet Take 10 mg by mouth daily.   Yes Historical Provider, MD  ibuprofen (ADVIL,MOTRIN) 800 MG tablet Take 800 mg by mouth every 8 (eight) hours as needed for mild pain.   Yes Historical Provider, MD  amoxicillin (AMOXIL) 875 MG tablet Take 1 tablet (875 mg total) by mouth 2 (two) times daily. Patient not taking: Reported on 03/16/2016 04/23/15   Tonye Pearson, MD  carbamide peroxide [facility]) 6.5 % otic solution Place 5 drops into both ears 2 (two) times daily as needed (ear wax removal). 03/16/16   Trixie Dredge, PA-C  escitalopram (LEXAPRO) 10 MG tablet Take 1 tablet (10 mg total) by mouth daily. Patient not taking: Reported on 04/23/2015 01/23/15   Adonis Brook, NP  montelukast (SINGULAIR) 10 MG tablet Take 1 tablet (10 mg total) by mouth at bedtime. Patient not taking: Reported on 04/23/2015 01/23/15   Adonis Brook, NP  neomycin-polymyxin-hydrocortisone (CORTISPORIN) 3.5-10000-1 otic suspension Place 4 drops into the left ear 3 (three) times daily. 03/16/16   Trixie Dredge, PA-C  predniSONE (DELTASONE) 20 MG tablet 3/3/2/2/1/1 single daily dose for 6 days Patient not taking: Reported on 03/16/2016 04/23/15   Tonye Pearson, MD    Family History No family history on file.  Social History Social History  Substance Use Topics  . Smoking status: Current Every Day Smoker    Packs/day: 0.50  . Smokeless tobacco: Never Used  . Alcohol use Yes     Comment: occasional     Allergies   Peanuts [peanut oil]   Review of Systems Review of Systems  Constitutional: Negative for chills and fever.  HENT: Positive for dental problem and ear pain. Negative for congestion, ear discharge, facial swelling, hearing loss, rhinorrhea, sinus pressure, sore throat and trouble swallowing.   Respiratory: Negative for cough and shortness of breath.   Musculoskeletal: Negative for neck pain and neck stiffness.     Physical Exam Updated Vital Signs BP 126/81 (BP Location: Right Arm)   Pulse 76   Temp 97.5 F (36.4 C) (Oral)   Resp 18   Ht 5\' 8"  (1.727 m)   Wt 131.5  kg   SpO2 99%   BMI 44.09 kg/m   Physical Exam  Constitutional: He appears well-developed and well-nourished. No distress.  HENT:  Head: Normocephalic and atraumatic.  Right Ear: No drainage, swelling or tenderness. No mastoid tenderness.  Left Ear: No drainage, swelling or tenderness. No mastoid tenderness.  Mouth/Throat: Uvula is midline and oropharynx is clear and moist. Mucous membranes are not dry. No trismus in the jaw. No uvula swelling. No oropharyngeal exudate, posterior oropharyngeal edema, posterior oropharyngeal erythema or tonsillar abscesses.  Bilateral ear canals filled  with cerumen.   Left lower second molar with deep decay, mildly tender to percussion.  No adjacent swelling.     Eyes: Conjunctivae and EOM are normal. Right eye exhibits no discharge. Left eye exhibits no discharge.  Neck: Normal range of motion. Neck supple.  Cardiovascular: Normal rate and regular rhythm.   Pulmonary/Chest: Effort normal and breath sounds normal. No stridor. No respiratory distress. He has no wheezes. He has no rales.  Lymphadenopathy:    He has no cervical adenopathy.  Neurological: He is alert.  Skin: He is not diaphoretic.  Nursing note and vitals reviewed.    ED Treatments / Results  Labs (all labs ordered are listed, but only abnormal results are displayed) Labs Reviewed - No data to display  EKG  EKG Interpretation None       Radiology No results found.  Procedures Procedures (including critical care time)  Medications Ordered in ED Medications  docusate sodium (COLACE) capsule 100 mg (100 mg Oral Given 03/16/16 0500)     Initial Impression / Assessment and Plan / ED Course  I have reviewed the triage vital signs and the nursing notes.  Pertinent labs & imaging results that were available during my care of the patient were reviewed by me and considered in my medical decision making (see chart for details).  Clinical Course    Afebrile, nontoxic patient with left ear pain x 3-4 days, only at night.  Ear canals full of cerumen.  Irrigated by Engineer, civil (consulting)nurse.  Pt feeling much better after irrigation.  Canal is erythematous.  D/C home with debrox, cortisporin otic.    Discussed result, findings, treatment, and follow up  with patient.  Pt given return precautions.  Pt verbalizes understanding and agrees with plan.       Final Clinical Impressions(s) / ED Diagnoses   Final diagnoses:  Cerumen impaction, left  Otitis externa, left    New Prescriptions New Prescriptions   CARBAMIDE PEROXIDE (DEBROX) 6.5 % OTIC SOLUTION    Place 5 drops into both ears  2 (two) times daily as needed (ear wax removal).   NEOMYCIN-POLYMYXIN-HYDROCORTISONE (CORTISPORIN) 3.5-10000-1 OTIC SUSPENSION    Place 4 drops into the left ear 3 (three) times daily.     Trixie Dredgemily Kaitlin Ardito, PA-C 03/16/16 [id number]    Tilden FossaElizabeth Rees, MD 03/20/16 [phone]

## 2016-03-16 NOTE — ED Triage Notes (Signed)
Pt complaining of left ear pain and some mild congestion. Pt states the pain is worse at night.

## 2016-03-19 ENCOUNTER — Emergency Department (HOSPITAL_COMMUNITY): Payer: Self-pay

## 2016-03-19 ENCOUNTER — Ambulatory Visit (INDEPENDENT_AMBULATORY_CARE_PROVIDER_SITE_OTHER): Payer: Self-pay | Admitting: Physician Assistant

## 2016-03-19 ENCOUNTER — Emergency Department (HOSPITAL_COMMUNITY)
Admission: EM | Admit: 2016-03-19 | Discharge: 2016-03-20 | Disposition: A | Discharge status: Home / Self Care | Payer: Self-pay | Attending: Emergency Medicine | Admitting: Emergency Medicine

## 2016-03-19 ENCOUNTER — Encounter (HOSPITAL_COMMUNITY): Payer: Self-pay | Admitting: Emergency Medicine

## 2016-03-19 ENCOUNTER — Encounter: Payer: Self-pay | Admitting: Physician Assistant

## 2016-03-19 VITALS — BP 126/82 | HR 85 | Temp 97.8°F | Resp 16 | Ht 68.0 in | Wt 290.0 lb

## 2016-03-19 DIAGNOSIS — F909 Attention-deficit hyperactivity disorder, unspecified type: Secondary | ICD-10-CM | POA: Insufficient documentation

## 2016-03-19 DIAGNOSIS — J309 Allergic rhinitis, unspecified: Secondary | ICD-10-CM

## 2016-03-19 DIAGNOSIS — Z91010 Allergy to peanuts: Secondary | ICD-10-CM | POA: Insufficient documentation

## 2016-03-19 DIAGNOSIS — G518 Other disorders of facial nerve: Secondary | ICD-10-CM

## 2016-03-19 DIAGNOSIS — H9202 Otalgia, left ear: Secondary | ICD-10-CM

## 2016-03-19 DIAGNOSIS — G500 Trigeminal neuralgia: Secondary | ICD-10-CM | POA: Insufficient documentation

## 2016-03-19 DIAGNOSIS — J45909 Unspecified asthma, uncomplicated: Secondary | ICD-10-CM | POA: Insufficient documentation

## 2016-03-19 DIAGNOSIS — H6121 Impacted cerumen, right ear: Secondary | ICD-10-CM

## 2016-03-19 DIAGNOSIS — F17210 Nicotine dependence, cigarettes, uncomplicated: Secondary | ICD-10-CM | POA: Insufficient documentation

## 2016-03-19 DIAGNOSIS — R0981 Nasal congestion: Secondary | ICD-10-CM

## 2016-03-19 LAB — I-STAT CREATININE, ED: CREATININE: 0.9 mg/dL (ref 0.61–1.24)

## 2016-03-19 MED ORDER — IOPAMIDOL (ISOVUE-300) INJECTION 61%: 75.0000 mL | Freq: Once | INTRAVENOUS | Status: DC | PRN

## 2016-03-19 MED ORDER — KETOROLAC TROMETHAMINE 30 MG/ML IJ SOLN
30.0000 mg | Freq: Once | INTRAMUSCULAR | Status: AC
  Administered 2016-03-19: 30 mg via INTRAVENOUS
  Filled 2016-03-19: qty 1

## 2016-03-19 MED ORDER — IOPAMIDOL (ISOVUE-300) INJECTION 61%
INTRAVENOUS | Status: AC
  Administered 2016-03-19: 75 mL
  Filled 2016-03-19: qty 100

## 2016-03-19 MED ORDER — PREDNISONE 20 MG PO TABS: 40.0000 mg | ORAL_TABLET | Freq: Every day | ORAL | 0 refills | Status: AC

## 2016-03-19 NOTE — ED Provider Notes (Signed)
[facility] Provider Note   CSN: [id number] Arrival date & time: 03/19/16  1858     History   Chief Complaint Chief Complaint  Patient presents with  . Headache    HPI Mason Lopez is a 30 y.o. male.  29 year old male with a history of asthma and seasonal allergies presents to the emergency department for evaluation of facial pain. Mason Lopez was seen 3 days ago for similar symptoms and was thought to have pain secondary to cerumen impaction. Mason Lopez states that his pain has not improved at all since air wax removal. Mason Lopez was discharged with ear drops for presumed infection. Mason Lopez has been using these as prescribed with no relief. Mason Lopez describes a sharp pain originating in his left preauricular region, radiating down to his lower jaw. Mason Lopez states that Mason Lopez has temporary improvement of the pain after swishing with ice cold water. Mason Lopez previously has tried ibuprofen with little improvement. Mason Lopez denies fever, inability to open history, dental pain, purulent drainage in the mouth, ear drainage, hearing changes, vision changes, extremity numbness/paresthesias, extremity weakness. No recent head injury or trauma.      Past Medical History:  Diagnosis Date  . ADHD (attention deficit hyperactivity disorder)   . Asthma   . Seasonal allergies     Patient Active Problem List   Diagnosis Date Noted  . BMI 40.0-44.9, adult (HCC) 04/23/2015  . MDD (major depressive disorder), recurrent severe, without psychosis (HCC)   . Generalized anxiety disorder 01/19/2015  . Cannabis abuse 01/19/2015  . Mood disorder (HCC) 01/18/2015    History reviewed. No pertinent surgical history.     Home Medications    Prior to Admission medications   Medication Sig Start Date End Date Taking? Authorizing Provider  carbamide peroxide (DEBROX) 6.5 % otic solution Place 5 drops into both ears 2 (two) times daily as needed (ear wax removal). 03/16/16  Yes Trixie DredgeEmily West, PA-C  cetirizine (ZYRTEC) 10 MG tablet Take 10 mg by  mouth daily.   Yes Historical Provider, MD  ibuprofen (ADVIL,MOTRIN) 800 MG tablet Take 800 mg by mouth every 8 (eight) hours as needed for mild pain.   Yes Historical Provider, MD  Multiple Vitamin (MULTIVITAMIN WITH MINERALS) TABS tablet Take 1 tablet by mouth daily.   Yes Historical Provider, MD  neomycin-polymyxin-hydrocortisone (CORTISPORIN) 3.5-10000-1 otic suspension Place 4 drops into the left ear 3 (three) times daily. 03/16/16  Yes Trixie DredgeEmily West, PA-C  predniSONE (DELTASONE) 20 MG tablet Take 2 tablets (40 mg total) by mouth daily. Take 40 mg by mouth daily for 3 days, then 20mg  by mouth daily for 3 days, then 10mg  daily for 3 days 03/19/16   Antony MaduraKelly Felma Pfefferle, PA-C    Family History No family history on file.  Social History Social History  Substance Use Topics  . Smoking status: Current Every Day Smoker    Packs/day: 0.50  . Smokeless tobacco: Never Used  . Alcohol use Yes     Comment: occasional     Allergies   Peanuts [peanut oil]   Review of Systems Review of Systems  Constitutional: Negative for fever.  HENT: Negative for facial swelling.        +facial pain  Eyes: Negative for photophobia and visual disturbance.  Gastrointestinal: Negative for nausea and vomiting.  Neurological: Negative for weakness and numbness.  Ten systems reviewed and are negative for acute change, except as noted in the HPI.     Physical Exam Updated Vital Signs BP 133/90 (BP Location: Left Arm)  Pulse 61   Temp 98.5 F (36.9 C) (Oral)   Resp 17   SpO2 99%   Physical Exam  Constitutional: Mason Lopez is oriented to person, place, and time. Mason Lopez appears well-developed and well-nourished. No distress.  Nontoxic appearing, in no distress  HENT:  Head: Normocephalic and atraumatic.  Right Ear: Tympanic membrane, external ear and ear canal normal.  Left Ear: Tympanic membrane, external ear and ear canal normal.  Mouth/Throat: Uvula is midline and oropharynx is clear and moist. No trismus in the jaw.  Dental caries present. No dental abscesses.  Soft oral floor. Dental caries and decay appreciated. No distinct gingival swelling or fluctuance. No dental tenderness to palpation. No trismus. Jaw opening appears symmetric. No localized tenderness to the TMJ joint bilaterally. Bilateral ear canals and TMs unremarkable.  Eyes: Conjunctivae and EOM are normal. No scleral icterus.  Neck: Normal range of motion.  No nuchal rigidity or meningismus  Cardiovascular: Normal rate, regular rhythm and intact distal pulses.   Pulmonary/Chest: Effort normal. No respiratory distress.  Respirations even and unlabored  Musculoskeletal: Normal range of motion.  Neurological: Mason Lopez is alert and oriented to person, place, and time. No cranial nerve deficit. Mason Lopez exhibits normal muscle tone. Coordination normal.  GCS 15. Speech is goal oriented. No focal neurologic deficits appreciated. Patient ambulatory with steady gait.  Skin: Skin is warm and dry. No rash noted. Mason Lopez is not diaphoretic. No erythema. No pallor.  Psychiatric: Mason Lopez has a normal mood and affect. His behavior is normal.  Nursing note and vitals reviewed.    ED Treatments / Results  Labs (all labs ordered are listed, but only abnormal results are displayed) Labs Reviewed  I-STAT CREATININE, ED    EKG  EKG Interpretation None       Radiology Ct Soft Tissue Neck W Contrast  Result Date: 03/19/2016 CLINICAL DATA:  Left jaw and neck pain. EXAM: CT NECK WITH CONTRAST TECHNIQUE: Multidetector CT imaging of the neck was performed using the standard protocol following the bolus administration of intravenous contrast. CONTRAST:  75 mL ISOVUE-300 IOPAMIDOL (ISOVUE-300) INJECTION 61% COMPARISON:  None. FINDINGS: Pharynx and larynx: There is fullness of the nasopharynx secondary to prominent adenoid and palatine tonsils. The pharynx is otherwise normal. The vocal folds are apposed. Salivary glands: The parotid and submandibular glands are normal. No  sialolithiasis or salivary ductal dilatation. Thyroid: Normal Lymph nodes: No cervical lymphadenopathy by CT size criteria. Vascular: The major cervical vessels are normal. Limited intracranial: Unremarkable Visualized orbits: Normal Mastoids and visualized paranasal sinuses: Left maxillary retention cyst. Otherwise clear. Skeleton: Unremarkable. No osseous abnormality of the temporomandibular joints, specifically. Upper chest: Normal IMPRESSION: No focal abnormality to explain the reported left jaw pain. No cervical mass or adenopathy Electronically Signed   By: Deatra Robinson M.D.   On: 03/19/2016 23:19    Procedures Procedures (including critical care time)  Medications Ordered in ED Medications  iopamidol (ISOVUE-300) 61 % injection 75 mL (not administered)  predniSONE (DELTASONE) tablet 60 mg (not administered)  ketorolac (TORADOL) 30 MG/ML injection 30 mg (30 mg Intravenous Given 03/19/16 2218)  iopamidol (ISOVUE-300) 61 % injection (75 mLs  Contrast Given 03/19/16 2239)     Initial Impression / Assessment and Plan / ED Course  I have reviewed the triage vital signs and the nursing notes.  Pertinent labs & imaging results that were available during my care of the patient were reviewed by me and considered in my medical decision making (see chart for details).  Clinical Course  30 year old male presents to the emergency department for evaluation of left-sided facial pain which extends into his jaw. Symptoms have been persistent since the patient was last seen 3 days ago. Mason Lopez has a nonfocal neurologic exam. No localized dental tenderness or evidence of dental abscess. No red flags or signs concerning for Ludwig's angina. There is symmetric jaw opening. Patient is afebrile.  CT soft tissue ordered to further evaluate dental cause of patient's discomfort. TMJ joints appear symmetric and patient has no evidence of dental abscess on imaging. CT, overall, negative for acute  abnormalities.  Suspect that patient's pain may be secondary to a facial neuralgia. Will place on a prednisone taper to try and help with pain control. Patient referred to ENT for further outpatient follow-up. Return precautions discussed and provided. Patient discharged in satisfactory condition with no unaddressed concerns.   Final Clinical Impressions(s) / ED Diagnoses   Final diagnoses:  Facial neuralgia    New Prescriptions New Prescriptions   PREDNISONE (DELTASONE) 20 MG TABLET    Take 2 tablets (40 mg total) by mouth daily. Take 40 mg by mouth daily for 3 days, then 20mg  by mouth daily for 3 days, then 10mg  daily for 3 days     Antony Madura, PA-C 03/20/16 0004    Lyndal Pulley, MD 03/20/16 [phone]

## 2016-03-19 NOTE — Progress Notes (Signed)
Patient ID: Mason Lopez, male    DOB: 1985/08/10, 30 y.o.   MRN: [id number]  PCP: No PCP Per Patient  Chief Complaint  Patient presents with  . Nasal Congestion    worse at night, for a couple of weeks  . Headache    Subjective:   HPI:  [age] yom presents with above issue.   States he has allergic rhinitis. Persistent throughout year. Takes zyrtec daily. For past few weeks has felt slightly more congested than normal. Denies HA, nasal drainage, fevers, chills. Has not tried nasal spray.   Also mentioning left facial pain just anterior to ear, just inside tragus near the TMJ. This has been present for about one week. States he went to an ER recently and was diagnosed with left ear infection, given drops. This did not help.   Denies cough, sore throat. No rhinorrhea.    Patient Active Problem List   Diagnosis Date Noted  . BMI 40.0-44.9, adult (HCC) 04/23/2015  . MDD (major depressive disorder), recurrent severe, without psychosis (HCC)   . Generalized anxiety disorder 01/19/2015  . Cannabis abuse 01/19/2015  . Mood disorder (HCC) 01/18/2015    Past Medical History:  Diagnosis Date  . ADHD (attention deficit hyperactivity disorder)   . Asthma   . Seasonal allergies      Prior to Admission medications   Medication Sig Start Date End Date Taking? Authorizing Provider  carbamide peroxide (DEBROX) 6.5 % otic solution Place 5 drops into both ears 2 (two) times daily as needed (ear wax removal). 03/16/16  Yes Trixie DredgeEmily West, PA-C  cetirizine (ZYRTEC) 10 MG tablet Take 10 mg by mouth daily.   Yes Historical Provider, MD  neomycin-polymyxin-hydrocortisone (CORTISPORIN) 3.5-10000-1 otic suspension Place 4 drops into the left ear 3 (three) times daily. 03/16/16  Yes Trixie DredgeEmily West, PA-C  amoxicillin (AMOXIL) 875 MG tablet Take 1 tablet (875 mg total) by mouth 2 (two) times daily. Patient not taking: Reported on 03/16/2016 04/23/15   Tonye Pearsonobert P Doolittle, MD  escitalopram (LEXAPRO) 10 MG tablet  Take 1 tablet (10 mg total) by mouth daily. Patient not taking: Reported on 04/23/2015 01/23/15   Adonis BrookSheila Agustin, NP  ibuprofen (ADVIL,MOTRIN) 800 MG tablet Take 800 mg by mouth every 8 (eight) hours as needed for mild pain.    Historical Provider, MD  montelukast (SINGULAIR) 10 MG tablet Take 1 tablet (10 mg total) by mouth at bedtime. Patient not taking: Reported on 04/23/2015 01/23/15   Adonis BrookSheila Agustin, NP  predniSONE (DELTASONE) 20 MG tablet 3/3/2/2/1/1 single daily dose for 6 days Patient not taking: Reported on 03/16/2016 04/23/15   Tonye Pearsonobert P Doolittle, MD    Allergies  Allergen Reactions  . Peanuts [Peanut Oil] Swelling    No past surgical history on file.  No family history on file.  Social History   Social History  . Marital status: Single    Spouse name: N/A  . Number of children: N/A  . Years of education: N/A   Social History Main Topics  . Smoking status: Current Every Day Smoker    Packs/day: 0.50  . Smokeless tobacco: Never Used  . Alcohol use Yes     Comment: occasional  . Drug use:     Types: Marijuana  . Sexual activity: Not Asked   Other Topics Concern  . None   Social History Narrative  . None   Review of Systems  See HPI.     Objective:  Physical Exam  Constitutional: He appears well-developed  and well-nourished.  Non-toxic appearance. He does not have a sickly appearance. He does not appear ill. No distress.  HENT:  Left Ear: Tympanic membrane normal. No drainage, swelling or tenderness. No mastoid tenderness. Tympanic membrane is not injected, not erythematous and not bulging.  No middle ear effusion.  Nose: Mucosal edema present. No rhinorrhea. Right sinus exhibits no maxillary sinus tenderness and no frontal sinus tenderness. Left sinus exhibits no maxillary sinus tenderness and no frontal sinus tenderness.  Mouth/Throat: Uvula is midline, oropharynx is clear and moist and mucous membranes are normal.  Right canal with cerumen impaction. TM clear  after flushing.   No clicking at left TMJ. No rash over area. No ttp over area. No pain with tragus manipulation. Oral exam without abnormality.   Pulmonary/Chest: Effort normal and breath sounds normal. No tachypnea.  Lymphadenopathy:       Head (right side): No submental, no submandibular and no tonsillar adenopathy present.       Head (left side): No submental, no submandibular and no tonsillar adenopathy present.    He has no cervical adenopathy.   Assessment & Plan:   Cerumen impaction, right  Allergic rhinitis, unspecified allergic rhinitis type  Nasal congestion  Pain in ear, left -- no signs of ear infection, TMJ dysfunction, skin infection, sinusitis, oral inection or other cause for left anterior ear pain -- ongoing allergic rhinitis and head/nasal congestion - continue zyrtec, start bid flonase, take sudafed for several days to help with congestion  -- tylenol scheduled for several days for possible inflammation at left TMJ, no signs arthritis on exam   Donnajean Lopes, PA-C Physician Assistant-Certified [facility]  03/19/2016 2:26 PM

## 2016-03-19 NOTE — Discharge Instructions (Signed)
Take prednisone as prescribed. Follow up with an ENT doctor. You may return for any new or concerning symptoms.

## 2016-03-19 NOTE — ED Notes (Signed)
Pt to CT via stretcher

## 2016-03-19 NOTE — Patient Instructions (Addendum)
I don't think you have a bacterial infection at this time. For the congestion, continue to take your zyrtec daily. Start using flonase, 2 sprays in each nostril twice daily. Continue to use this throughout allergy season.  Taking sudafed 2-3 times daily for the next 4-5 days will help with the congestion. For your right ear pain, scheduling 650 tylenol every 6 hours will help with the pain. Be sure not to take more than 4000 mg of tylenol daily.  We flushed out your right ear today.  If you're not feeling better in 3-4 days please let us know.   Allergic Rhinitis Allergic rhinitis is when the mucous membranes in the nose respond to allergens. Allergens are particles in the air that cause your body to have an allergic reaction. This causes you to release allergic antibodies. Through a chain of events, these eventually cause you to release histamine into the blood stream. Although meant to protect the body, it is this release of histamine that causes your discomfort, such as frequent sneezing, congestion, and an itchy, runny nose.  CAUSES Seasonal allergic rhinitis (hay fever) is caused by pollen allergens that may come from grasses, trees, and weeds. Year-round allergic rhinitis (perennial allergic rhinitis) is caused by allergens such as house dust mites, pet dander, and mold spores. SYMPTOMS  Nasal stuffiness (congestion).  Itchy, runny nose with sneezing and tearing of the eyes. DIAGNOSIS Your health care provider can help you determine the allergen or allergens that trigger your symptoms. If you and your health care provider are unable to determine the allergen, skin or blood testing may be used. Your health care provider will diagnose your condition after taking your health history and performing a physical exam. Your health care provider may assess you for other related conditions, such as asthma, pink eye, or an ear infection. TREATMENT Allergic rhinitis does not have a cure, but it can be  controlled by:  Medicines that block allergy symptoms. These may include allergy shots, nasal sprays, and oral antihistamines.  Avoiding the allergen. Hay fever may often be treated with antihistamines in pill or nasal spray forms. Antihistamines block the effects of histamine. There are over-the-counter medicines that may help with nasal congestion and swelling around the eyes. Check with your health care provider before taking or giving this medicine. If avoiding the allergen or the medicine prescribed do not work, there are many new medicines your health care provider can prescribe. Stronger medicine may be used if initial measures are ineffective. Desensitizing injections can be used if medicine and avoidance does not work. Desensitization is when a patient is given ongoing shots until the body becomes less sensitive to the allergen. Make sure you follow up with your health care provider if problems continue. HOME CARE INSTRUCTIONS It is not possible to completely avoid allergens, but you can reduce your symptoms by taking steps to limit your exposure to them. It helps to know exactly what you are allergic to so that you can avoid your specific triggers. SEEK MEDICAL CARE IF:  You have a fever.  You develop a cough that does not stop easily (persistent).  You have shortness of breath.  You start wheezing.  Symptoms interfere with normal daily activities.   This information is not intended to replace advice given to you by your health care provider. Make sure you discuss any questions you have with your health care provider.   Document Released: 03/31/2001 Document Revised: 07/27/2014 Document Reviewed: 03/13/2013 Elsevier Interactive Patient Education 2016 Elsevier  Inc.     IF you received an x-ray today, you will receive an invoice from [facility]. Please contact [facility] at [phone] with questions or concerns regarding your invoice.   IF you received  labwork today, you will receive an invoice from [facility]. Please contact [facility] at [phone] with questions or concerns regarding your invoice.   Our billing staff will not be able to assist you with questions regarding bills from these companies.  You will be contacted with the lab results as soon as they are available. The fastest way to get your results is to activate your My Chart account. Instructions are located on the last page of this paperwork. If you have not heard from us regarding the results in 2 weeks, please contact this office.

## 2016-03-19 NOTE — ED Triage Notes (Signed)
Pt states hes had a headache in the front of his head for over a week and hes been to his doctor and given meds and nothing has helped. Pt ambulatory with steady gait, in NAD, no neuro deficits.

## 2016-03-20 MED ORDER — PREDNISONE 20 MG PO TABS
60.0000 mg | ORAL_TABLET | Freq: Once | ORAL | Status: AC
  Administered 2016-03-20: 60 mg via ORAL
  Filled 2016-03-20: qty 3

## 2016-06-05 ENCOUNTER — Encounter (HOSPITAL_COMMUNITY): Payer: Self-pay | Admitting: *Deleted

## 2016-06-05 ENCOUNTER — Emergency Department (HOSPITAL_COMMUNITY)
Admission: EM | Admit: 2016-06-05 | Discharge: 2016-06-06 | Disposition: A | Discharge status: Home / Self Care | Payer: Managed Care, Other (non HMO) | Attending: Emergency Medicine | Admitting: Emergency Medicine

## 2016-06-05 ENCOUNTER — Emergency Department (HOSPITAL_COMMUNITY): Payer: Managed Care, Other (non HMO)

## 2016-06-05 DIAGNOSIS — J45909 Unspecified asthma, uncomplicated: Secondary | ICD-10-CM | POA: Insufficient documentation

## 2016-06-05 DIAGNOSIS — B029 Zoster without complications: Secondary | ICD-10-CM | POA: Insufficient documentation

## 2016-06-05 DIAGNOSIS — Z91010 Allergy to peanuts: Secondary | ICD-10-CM | POA: Insufficient documentation

## 2016-06-05 DIAGNOSIS — F17200 Nicotine dependence, unspecified, uncomplicated: Secondary | ICD-10-CM | POA: Insufficient documentation

## 2016-06-05 DIAGNOSIS — F909 Attention-deficit hyperactivity disorder, unspecified type: Secondary | ICD-10-CM | POA: Insufficient documentation

## 2016-06-05 LAB — CBC
HCT: 43.8 % (ref 39.0–52.0)
HEMOGLOBIN: 15.1 g/dL (ref 13.0–17.0)
MCH: 31.5 pg (ref 26.0–34.0)
MCHC: 34.5 g/dL (ref 30.0–36.0)
MCV: 91.4 fL (ref 78.0–100.0)
Platelets: 294 10*3/uL (ref 150–400)
RBC: 4.79 MIL/uL (ref 4.22–5.81)
RDW: 12.4 % (ref 11.5–15.5)
WBC: 7.7 10*3/uL (ref 4.0–10.5)

## 2016-06-05 LAB — BASIC METABOLIC PANEL
ANION GAP: 9 (ref 5–15)
BUN: 14 mg/dL (ref 6–20)
CALCIUM: 9.4 mg/dL (ref 8.9–10.3)
CO2: 26 mmol/L (ref 22–32)
Chloride: 105 mmol/L (ref 101–111)
Creatinine, Ser: 0.95 mg/dL (ref 0.61–1.24)
GLUCOSE: 90 mg/dL (ref 65–99)
Potassium: 4 mmol/L (ref 3.5–5.1)
Sodium: 140 mmol/L (ref 135–145)

## 2016-06-05 LAB — TROPONIN I

## 2016-06-05 LAB — I-STAT TROPONIN, ED: TROPONIN I, POC: 0 ng/mL (ref 0.00–0.08)

## 2016-06-05 NOTE — ED Triage Notes (Signed)
For 3-4 days the pt has been c/o lt chest pain with lt arm radiation for 4-5 days no previous history

## 2016-06-05 NOTE — ED Notes (Signed)
Pt stable, ambulatory, states understanding of discharge instructions 

## 2016-06-05 NOTE — ED Provider Notes (Signed)
[facility]-EMERGENCY DEPT Provider Note   CSN: [id number] Arrival date & time: 06/05/16  1826     History   Chief Complaint Chief Complaint  Patient presents with  . Chest Pain    HPI Mason Lopez A Threats is a 30 y.o. male.  HPI   Pt has PMH of asthma, ADHD, and seasonal allergies. He reports having pain on and off of his chest for the past 4-5 days. Two weeks ago he had an area of stinging to his back that wrapped around his left chest towards the front, then he developed small blisters to the area, it has now been two weeks since that episode and he still has healing blisters that are scabs to this area. He has been using hydrocortisone cream and bite away cream. He assumed he had been stung by a bee but did not see a bee sting him. He has not had any N,V, diaphoresis, diarrhea, weakness, confusion, back pain, cough, sore throat, ear pain.  Denies PMH of cardiac problems or high blood pressure. He came in today after having 3 minute episode or worsened pain earlier today, more of a stinging sensation.  Past Medical History:  Diagnosis Date  . ADHD (attention deficit hyperactivity disorder)   . Asthma   . Seasonal allergies     Patient Active Problem List   Diagnosis Date Noted  . BMI 40.0-44.9, adult (HCC) 04/23/2015  . MDD (major depressive disorder), recurrent severe, without psychosis (HCC)   . Generalized anxiety disorder 01/19/2015  . Cannabis abuse 01/19/2015  . Mood disorder (HCC) 01/18/2015    History reviewed. No pertinent surgical history.    Home Medications    Prior to Admission medications   Medication Sig Start Date End Date Taking? Authorizing Provider  loratadine (CLARITIN) 10 MG tablet Take 10 mg by mouth daily.   Yes Historical Provider, MD  neomycin-bacitracin-polymyxin (NEOSPORIN) OINT Apply 1 application topically daily as needed for irritation or wound care.    Yes Historical Provider, MD  carbamide peroxide (DEBROX) 6.5 % otic solution Place 5 drops  into both ears 2 (two) times daily as needed (ear wax removal). Patient not taking: Reported on 06/05/2016 03/16/16   Trixie DredgeEmily West, PA-C  neomycin-polymyxin-hydrocortisone (CORTISPORIN) 3.5-10000-1 otic suspension Place 4 drops into the left ear 3 (three) times daily. Patient not taking: Reported on 06/05/2016 03/16/16   Trixie DredgeEmily West, PA-C  predniSONE (DELTASONE) 20 MG tablet Take 2 tablets (40 mg total) by mouth daily. Take 40 mg by mouth daily for 3 days, then 20mg  by mouth daily for 3 days, then 10mg  daily for 3 days Patient not taking: Reported on 06/05/2016 03/19/16   Antony MaduraKelly Humes, PA-C    Family History No family history on file.  Social History Social History  Substance Use Topics  . Smoking status: Current Every Day Smoker    Packs/day: 0.50  . Smokeless tobacco: Never Used  . Alcohol use Yes     Comment: occasional     Allergies   Peanuts [peanut oil]   Review of Systems Review of Systems Review of Systems All other systems negative except as documented in the HPI. All pertinent positives and negatives as reviewed in the HPI.   Physical Exam Updated Vital Signs BP 117/86   Pulse 74   Temp 98.3 F (36.8 C) (Oral)   Resp 22   Ht 5\' 9"  (1.753 m)   Wt 131.5 kg   SpO2 99%   BMI 42.83 kg/m   Physical Exam  Constitutional: He  appears well-developed and well-nourished. No distress.  HENT:  Head: Normocephalic and atraumatic.  Eyes: Pupils are equal, round, and reactive to light.  Neck: Normal range of motion. Neck supple.  Cardiovascular: Normal rate and regular rhythm.   Pulmonary/Chest: Effort normal.        Scabbed cluster of lesions following dermatome.   Abdominal: Soft.  Neurological: He is alert.  Skin: Skin is warm and dry.  Nursing note and vitals reviewed.    ED Treatments / Results  Labs (all labs ordered are listed, but only abnormal results are displayed) Labs Reviewed  BASIC METABOLIC PANEL  CBC  TROPONIN I  I-STAT TROPOININ, ED     EKG  EKG Interpretation None       Radiology Dg Chest 2 View  Result Date: 06/05/2016 CLINICAL DATA:  Chest tightness for 4-5 days. Multiple insect bites. EXAM: CHEST  2 VIEW COMPARISON:  Chest radiograph Nov 29, 2011 FINDINGS: Cardiomediastinal silhouette is normal. No pleural effusions or focal consolidations. Trachea projects midline and there is no pneumothorax. Soft tissue planes and included osseous structures are non-suspicious. Large body habitus. IMPRESSION: Normal chest radiograph. Electronically Signed   By: Awilda Metroourtnay  Bloomer M.D.   On: 06/05/2016 20:05    Procedures Procedures (including critical care time)  Medications Ordered in ED Medications - No data to display   Initial Impression / Assessment and Plan / ED Course  I have reviewed the triage vital signs and the nursing notes.  Pertinent labs & imaging results that were available during my care of the patient were reviewed by me and considered in my medical decision making (see chart for details).  Clinical Course    Pt with shingle infection that is likely causing his sharp pains, he has been using hydrocortisone cream which has increased his healing time but has not made the infection worse. Advised to discontinue using this cream. He is well past 3 days ant Antivirals will not help. Pt given education and [facility]. Chest xray, Troponin and blood work all unremarkable.  Final Clinical Impressions(s) / ED Diagnoses   Final diagnoses:  Herpes zoster without complication    New Prescriptions New Prescriptions   No medications on file     Marlon Peliffany Letroy Vazguez, PA-C 06/05/16 2327    Alvira MondayErin Schlossman, MD 06/06/16 1342

## 2018-07-24 ENCOUNTER — Other Ambulatory Visit: Payer: Self-pay

## 2018-07-24 ENCOUNTER — Emergency Department (HOSPITAL_COMMUNITY): Payer: Self-pay

## 2018-07-24 ENCOUNTER — Encounter (HOSPITAL_COMMUNITY): Payer: Self-pay | Admitting: Emergency Medicine

## 2018-07-24 ENCOUNTER — Emergency Department (HOSPITAL_COMMUNITY)
Admission: EM | Admit: 2018-07-24 | Discharge: 2018-07-24 | Discharge status: Against Medical Advice | Payer: Self-pay | Attending: Emergency Medicine | Admitting: Emergency Medicine

## 2018-07-24 DIAGNOSIS — R079 Chest pain, unspecified: Secondary | ICD-10-CM | POA: Insufficient documentation

## 2018-07-24 DIAGNOSIS — R0602 Shortness of breath: Secondary | ICD-10-CM | POA: Insufficient documentation

## 2018-07-24 DIAGNOSIS — Z5321 Procedure and treatment not carried out due to patient leaving prior to being seen by health care provider: Secondary | ICD-10-CM | POA: Insufficient documentation

## 2018-07-24 LAB — CBC
HEMATOCRIT: 46.9 % (ref 39.0–52.0)
HEMOGLOBIN: 15.6 g/dL (ref 13.0–17.0)
MCH: 31.3 pg (ref 26.0–34.0)
MCHC: 33.3 g/dL (ref 30.0–36.0)
MCV: 94 fL (ref 80.0–100.0)
Platelets: 330 10*3/uL (ref 150–400)
RBC: 4.99 MIL/uL (ref 4.22–5.81)
RDW: 11.9 % (ref 11.5–15.5)
WBC: 15.9 10*3/uL — AB (ref 4.0–10.5)
nRBC: 0 % (ref 0.0–0.2)

## 2018-07-24 LAB — BASIC METABOLIC PANEL
Anion gap: 10 (ref 5–15)
BUN: 9 mg/dL (ref 6–20)
CHLORIDE: 102 mmol/L (ref 98–111)
CO2: 26 mmol/L (ref 22–32)
CREATININE: 0.98 mg/dL (ref 0.61–1.24)
Calcium: 9.4 mg/dL (ref 8.9–10.3)
GFR calc Af Amer: >60 mL/min (ref >60)
GLUCOSE: 105 mg/dL — AB (ref 70–99)
POTASSIUM: 3.9 mmol/L (ref 3.5–5.1)
SODIUM: 138 mmol/L (ref 135–145)

## 2018-07-24 LAB — I-STAT TROPONIN, ED: Troponin i, poc: 0 ng/mL (ref 0.00–0.08)

## 2018-07-24 NOTE — ED Triage Notes (Signed)
Pt. Stated, I started having chest pain with back pain with SOB. This is all new

## 2018-07-24 NOTE — ED Notes (Signed)
Pt stated he felt like nothing was being done for him and does not want to wait anymore. Pt was seen leaving.

## 2019-10-19 IMAGING — DX DG CHEST 2V
2 series · 2 of 2 positions shown · non-contrast
Comparison: 06/05/2016

CLINICAL DATA: Upper back pain with shortness of breath since
yesterday.

EXAM:
CHEST - 2 VIEW

[chest pa]
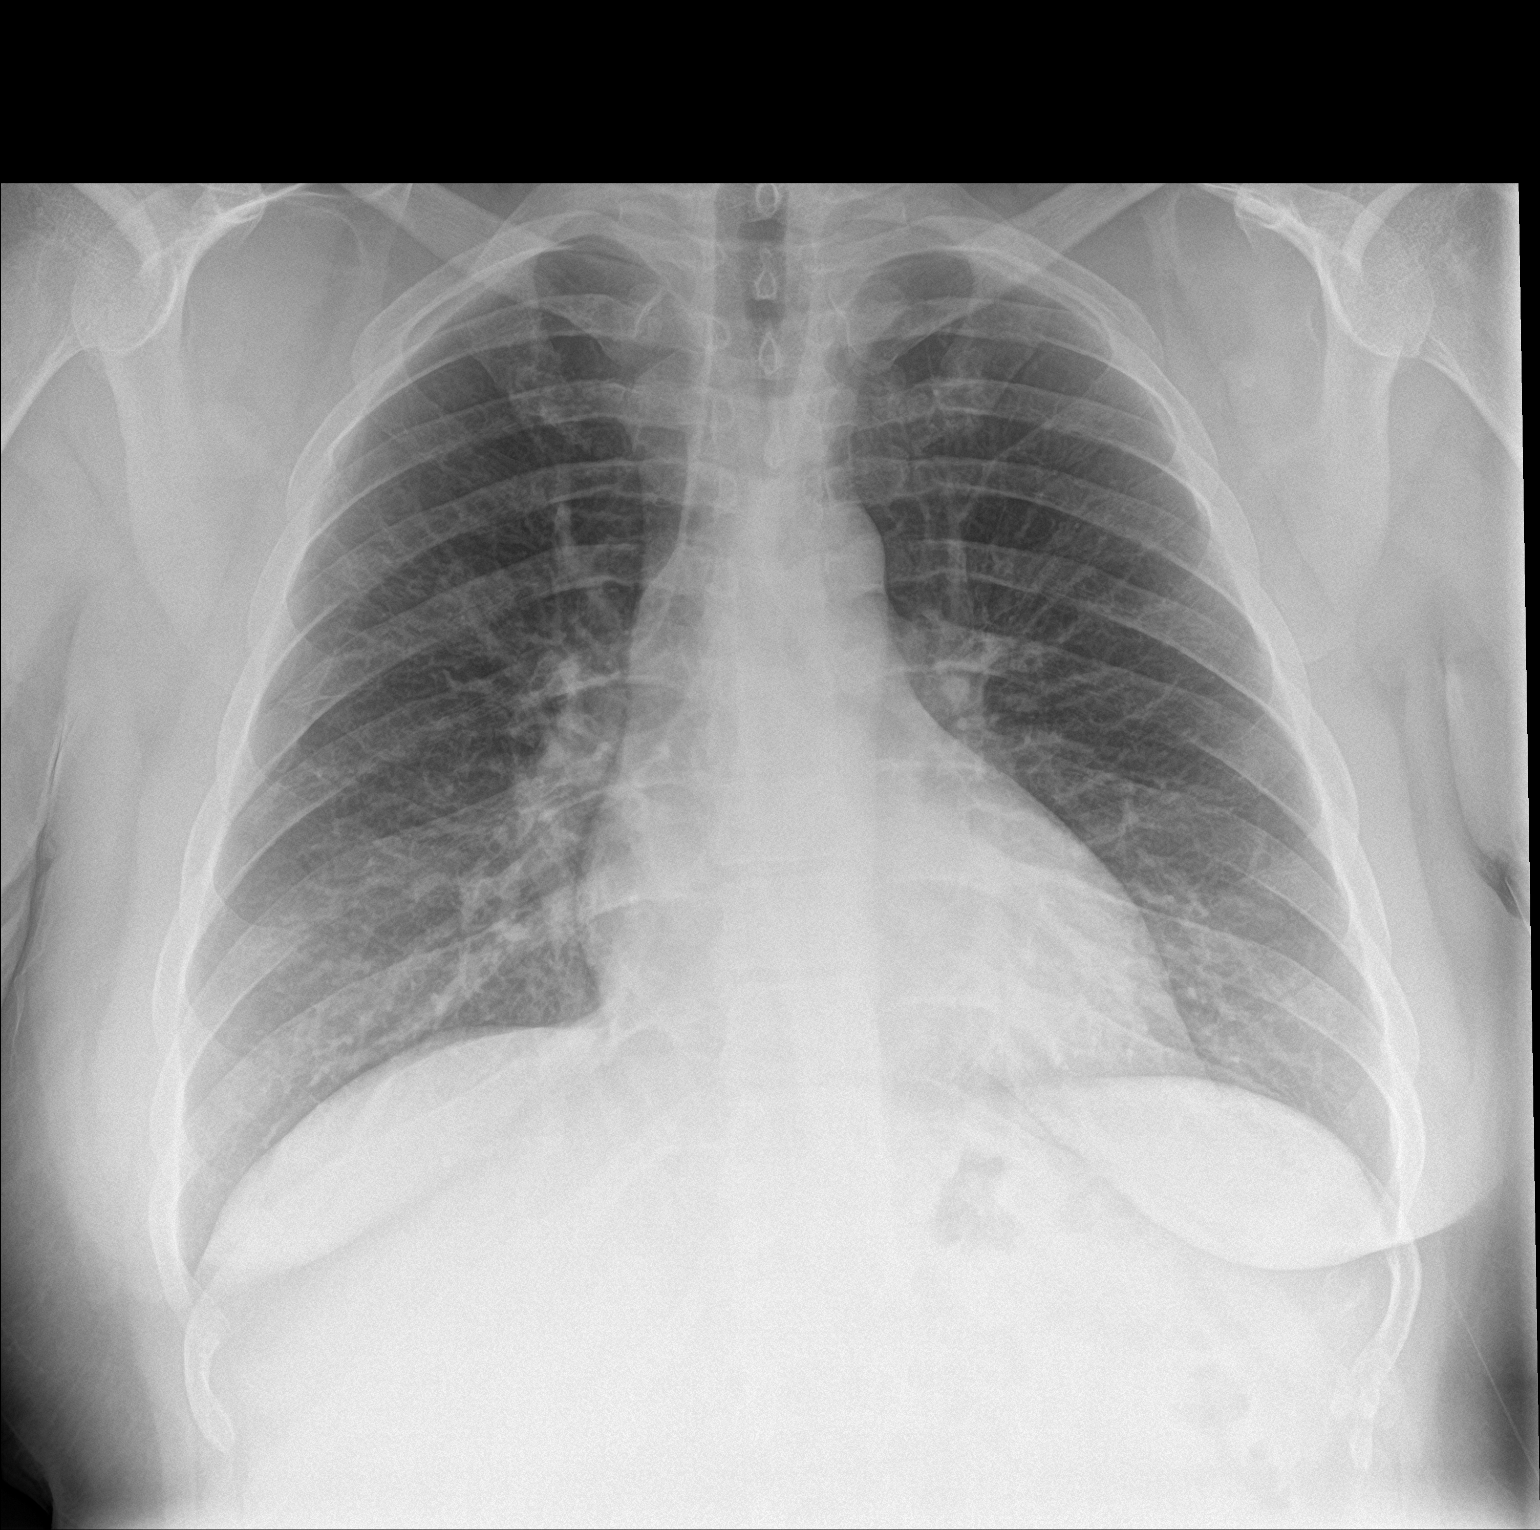

[chest lat]
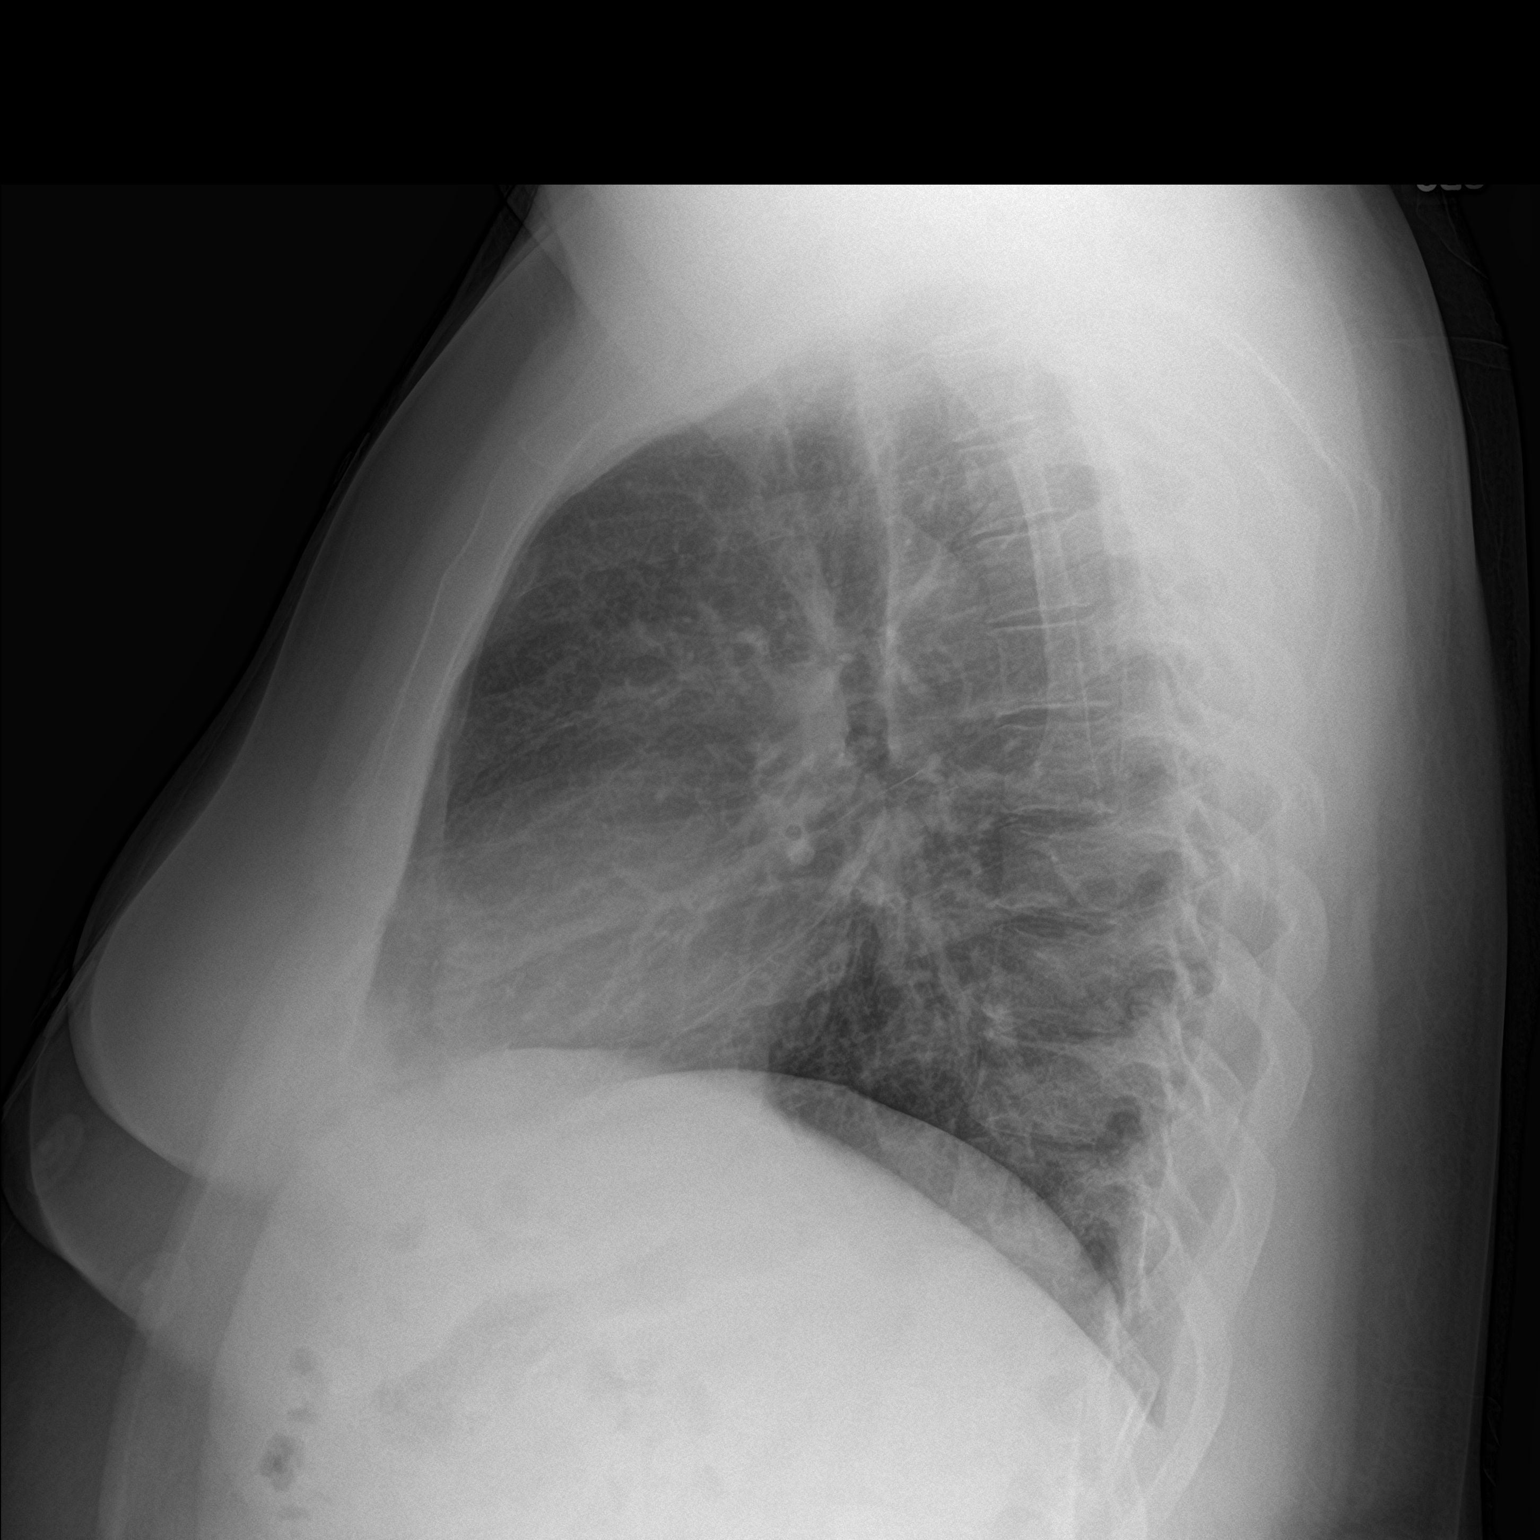

[2 of 2 positions shown; findings below may reference images not displayed]

FINDINGS: Lungs are adequately inflated and otherwise clear. Cardiomediastinal
silhouette and remainder of the exam is unchanged.
IMPRESSION: No active cardiopulmonary disease.

## 2020-08-26 DIAGNOSIS — J4520 Mild intermittent asthma, uncomplicated: Secondary | ICD-10-CM | POA: Insufficient documentation

## 2024-05-09 ENCOUNTER — Ambulatory Visit: Payer: Self-pay

## 2024-05-09 VITALS — BP 130/89 | HR 72 | Temp 98.2°F | Resp 16 | Ht 68.0 in | Wt 269.0 lb

## 2024-05-09 DIAGNOSIS — Z1159 Encounter for screening for other viral diseases: Secondary | ICD-10-CM

## 2024-05-09 DIAGNOSIS — Z7689 Persons encountering health services in other specified circumstances: Secondary | ICD-10-CM

## 2024-05-09 DIAGNOSIS — Z0000 Encounter for general adult medical examination without abnormal findings: Secondary | ICD-10-CM

## 2024-05-09 DIAGNOSIS — Z23 Encounter for immunization: Secondary | ICD-10-CM | POA: Diagnosis not present

## 2024-05-09 DIAGNOSIS — Z114 Encounter for screening for human immunodeficiency virus [HIV]: Secondary | ICD-10-CM

## 2024-05-09 DIAGNOSIS — Z713 Dietary counseling and surveillance: Secondary | ICD-10-CM | POA: Diagnosis not present

## 2024-05-09 NOTE — Progress Notes (Signed)
     Patient ID: Mason Lopez, male    DOB: May 13, 1986  MRN: [id number]  CC: Establish Care   Subjective: Mason Lopez is a 38 y.o. male who presents to clinic to establish care. Pt reports occasional high blood pressure readings.  Patient recently made changes to his diet and is trying to eat less fast food and foods with high salt content.  Patient is interested in losing weight and has began an exercise routine that includes cardio and weight training.  No acute concerns at this time.    Allergies  Allergen Reactions   Peanuts [Peanut Oil] Swelling    ROS: Review of Systems Negative except as stated above  PHYSICAL EXAM: BP 130/89   Pulse 72   Temp 98.2 F (36.8 C) (Oral)   Resp 16   Ht 5' 8 (1.727 m)   Wt 269 lb (122 kg)   SpO2 96%   BMI 40.90 kg/m   Physical Exam  General: well-appearing, no acute distress Skin: no jaundice, rashes, or lesions Head: normocephalic, no lesions, no abnormal hair distribution or loss Eyes: anicteric sclera, pupils equally round and reactive to light and accommodation, extraocular movements intact Ears: no external lesions, tympanic membrane translucent  Nose: no septal deviation, turbinates clear Throat: trachea midline, no thyromegaly, moist mucus membranes Cardiovascular: regular heart rate and rhythm, normal S1/S2, no murmurs, gallops, or rubs, peripheral pulses 2+ bilaterally Chest: no skeletal deformity, lungs clear to auscultation bilaterally, equal breath sounds bilaterally Abdomen: soft, non-distended, non-tender to palpation, no hepatomegaly, no splenomegaly, normoactive bowel sounds Musculoskeletal: strength of upper and lower extremities equal bilaterally, 5/5, normal gait Extremities: no peripheral edema, nails intact Neurologic: cranial nerves II-XII intact  ASSESSMENT AND PLAN:  1. Encounter to establish care with new provider  2. Encounter for annual wellness visit (Primary) - Complete physical exam performed  today, no abnormal findings.  3. Morbid obesity (HCC) - Pt encouraged to eat a reduced calorie diet, avoiding fast food and foods high in sodium - Encouraged to increase physical activity to 150 minutes of moderately intensive exercise per week. - Consider weight loss medication next visit.   4. Encounter for immunization - Flu vaccine trivalent PF, 6mos and older(Flulaval,Afluria,Fluarix,Fluzone)  5. Encounter for screening for HIV - HIV Antibody (routine testing w rflx)  6. Encounter for hepatitis C screening test for low risk patient - Hepatitis C antibody      Patient was given the opportunity to ask questions.  Patient verbalized understanding of the plan and was able to repeat key elements of the plan.    Orders Placed This Encounter  Procedures   Flu vaccine trivalent PF, 6mos and older(Flulaval,Afluria,Fluarix,Fluzone)   HIV Antibody (routine testing w rflx)   Hepatitis C antibody     Requested Prescriptions    No prescriptions requested or ordered in this encounter    Return in about 1 month (around 06/09/2024) for follow-up weight loss.  Sula Leavy Rode, PA-C

## 2024-05-10 LAB — HEPATITIS C ANTIBODY: Hep C Virus Ab: NONREACTIVE

## 2024-05-10 LAB — HIV ANTIBODY (ROUTINE TESTING W REFLEX): HIV Screen 4th Generation wRfx: NONREACTIVE

## 2024-05-11 ENCOUNTER — Ambulatory Visit: Payer: Self-pay

## 2024-05-11 NOTE — Progress Notes (Signed)
 Your test for HIV and Hep C were non-reactive which is good news. You do not have either of these disease.  Per our previous discussion regarding HIV pre-exposure prophylaxis. The medication used is tenofovir-emtricitabine medication (Truvada) which is taken once daily. Common side effects with long term use of medication: Reduced kidney function and Bone loss.  If you would like to proceed with treatment, please let me know. We can also further discuss at our next visit.  I highly recommend use of condoms whenever possible (in addition to using PrEP) to reduce the risk of acquiring other sexually transmitted infections (STIs) and hepatitis B and C virus.

## 2024-06-01 ENCOUNTER — Ambulatory Visit: Payer: Self-pay

## 2024-06-13 ENCOUNTER — Ambulatory Visit (INDEPENDENT_AMBULATORY_CARE_PROVIDER_SITE_OTHER)

## 2024-06-13 DIAGNOSIS — Z23 Encounter for immunization: Secondary | ICD-10-CM

## 2024-06-13 MED ORDER — ZEPBOUND 2.5 MG/0.5ML ~~LOC~~ SOAJ: 2.5000 mg | SUBCUTANEOUS | 0 refills | Status: AC

## 2024-06-13 MED ORDER — ZEPBOUND 7.5 MG/0.5ML ~~LOC~~ SOAJ: 7.5000 mg | SUBCUTANEOUS | 0 refills | Status: DC

## 2024-06-13 MED ORDER — ZEPBOUND 7.5 MG/0.5ML ~~LOC~~ SOAJ: 7.5000 mg | SUBCUTANEOUS | 0 refills | Status: AC

## 2024-06-13 MED ORDER — ZEPBOUND 5 MG/0.5ML ~~LOC~~ SOAJ: 5.0000 mg | SUBCUTANEOUS | 0 refills | Status: AC

## 2024-06-13 MED ORDER — ZEPBOUND 5 MG/0.5ML ~~LOC~~ SOAJ: 5.0000 mg | SUBCUTANEOUS | 0 refills | Status: DC

## 2024-06-13 NOTE — Progress Notes (Signed)
     Patient ID: Mason Lopez, male    DOB: [birth year]  MRN: [id number]  CC: Weight Check   Subjective: Mason Lopez is a 38 y.o. male with past medical history of obesity who presents to clinic to discuss weight loss. Pt reports that since last visit he has been trying to eat healthier meals and has started walking for exercise. Reports interest in weight loss medication.    Allergies  Allergen Reactions   Peanuts [Peanut Oil] Swelling    ROS: Review of Systems Negative except as stated above  PHYSICAL EXAM: BP 119/81 (BP Location: Right Arm)   Pulse 76   Temp 97.8 F (36.6 C)   Resp 18   Ht 5' 8 (1.727 m)   Wt 263 lb 6.4 oz (119.5 kg)   SpO2 95%   BMI 40.05 kg/m   Physical Exam  General: well-appearing, no acute distress Skin: no jaundice, rashes, or lesions Cardiovascular: regular heart rate and rhythm, normal S1/S2, no murmurs, gallops, or rubs, peripheral pulses 2+ bilaterally Chest: no skeletal deformity, lungs clear to auscultation bilaterally, equal breath sounds bilaterally Abdomen: soft, non-distended, non-tender to palpation, no hepatomegaly, no splenomegaly, normoactive bowel sounds Musculoskeletal: normal gait Extremities: no peripheral edema  ASSESSMENT AND PLAN: 1. Morbid obesity (HCC) (Primary) - Pt's weight today is 263lbs, compared to 269lbs on 10/21. Has lost 6 pounds with lifestyle changes. Pt congratulated on this.  - patient will adhere to a reduced calorie diet of 1800 calories per day. The patient will continue lifestyle modifications including healthy diet and 150 minutes of moderate intensity exercise per week.   - tirzepatide  (ZEPBOUND ) 2.5 MG/0.5ML Pen; Inject 2.5 mg into the skin once a week.  Dispense: 2 mL; Refill: 0 for 4 weeks. - increase tirzepatide  to 5mg  weekly after 4 weeks - increase tirzepatide  to 7.5mg  weekly after 8 weeks - instructions provided to patient. Pt expressed understanding.  - Reviewed common side effects of  medication - Emphasized importance of maintaining healthy lifestyle changes.   2. Immunization due - Pneumococcal conjugate vaccine 20-valent    Patient was given the opportunity to ask questions.  Patient verbalized understanding of the plan and was able to repeat key elements of the plan.    Orders Placed This Encounter  Procedures   Pneumococcal conjugate vaccine 20-valent     Requested Prescriptions   Signed Prescriptions Disp Refills   tirzepatide  (ZEPBOUND ) 2.5 MG/0.5ML Pen 2 mL 0    Sig: Inject 2.5 mg into the skin once a week.   tirzepatide  (ZEPBOUND ) 5 MG/0.5ML Pen 2 mL 0    Sig: Inject 5 mg into the skin once a week.   tirzepatide  (ZEPBOUND ) 7.5 MG/0.5ML Pen 2 mL 0    Sig: Inject 7.5 mg into the skin once a week.    Return in about 1 month (around 07/13/2024) for follow-up weight loss.  Sula Leavy Rode, PA-C

## 2024-06-22 NOTE — Telephone Encounter (Signed)
 PA sent to [facility]

## 2024-07-14 ENCOUNTER — Other Ambulatory Visit: Payer: Self-pay

## 2024-07-27 ENCOUNTER — Ambulatory Visit (INDEPENDENT_AMBULATORY_CARE_PROVIDER_SITE_OTHER)

## 2024-07-27 VITALS — BP 126/88 | HR 86 | Temp 98.1°F | Resp 16 | Ht 68.75 in | Wt 254.0 lb

## 2024-07-27 DIAGNOSIS — M79671 Pain in right foot: Secondary | ICD-10-CM | POA: Diagnosis not present

## 2024-07-27 DIAGNOSIS — Z9181 History of falling: Secondary | ICD-10-CM | POA: Diagnosis not present

## 2024-07-27 NOTE — Progress Notes (Signed)
" ° ° ° °  Patient ID: Mason Lopez, male    DOB: 1985/10/23  MRN: [id number]  CC: Weight Loss (Patient is her to discuss weight loss medication)   Subjective: Mason Lopez is a 39 y.o. male with past medical history of obesity who presents to clinic for follow up. PT reports that he has not been able to obtain weight loss medication due to cost and issues with health insurance. Continues to eat healthier and exercise. Pt wanted to inform provider that 3 weeks ago, pt fell onto his left side after hanging onto the back of a truck and losing grip. Reports he hit his head on the ground on the left side, and scraped left arm.  No current pain. Denies loss of consciousness, headaches, memory loss, or increased fatigue.   Additionally patient reports right heel pain for the past 2 months, reports he experiences pain daily.  Pain is worse in the morning.  Has tried different insoles but has not had any relief.  Denies falls, trauma.  Allergies[1]  ROS: Review of Systems Negative except as stated above  PHYSICAL EXAM: BP 126/88   Pulse 86   Temp 98.1 F (36.7 C) (Oral)   Resp 16   Ht 5' 8.75 (1.746 m)   Wt 254 lb (115.2 kg)   SpO2 95%   BMI 37.78 kg/m   Physical Exam  General: well-appearing, no acute distress Skin: no jaundice, rashes, or lesions Head: No ecchmosis noted. Mild tenderness to palpation on lateral surface of left orbit  Cardiovascular: regular heart rate and rhythm, normal S1/S2, no murmurs, gallops, or rubs Chest: no skeletal deformity, lungs clear to auscultation bilaterally, equal breath sounds bilaterally Musculoskeletal: normal gait.  Right heel pain with heel squeeze, no pain on plantar surface of foot, normal range of motion at right ankle.  Extremities: no peripheral edema  ASSESSMENT AND PLAN:  1. History of recent fall (Primary) - Not concerning given reassuring physical exam and no active symptoms   2. Morbid obesity (HCC) - Patient advised to reach out  to insurance to find out which weight loss medication is better covered. - Patient will continue to adhere to a reduced calorie diet of 1800 calories per day. The patient will continue lifestyle modifications including healthy diet and 150 minutes of moderate intensity exercise per week.    3. Pain of right heel - DG Foot Complete Right; Future - Recommended Voltaren gel for pain relief   Patient was given the opportunity to ask questions.  Patient verbalized understanding of the plan and was able to repeat key elements of the plan.    Orders Placed This Encounter  Procedures   DG Foot Complete Right     Requested Prescriptions    No prescriptions requested or ordered in this encounter    No follow-ups on file.  Sula Cower Desmen Schoffstall, PA-C      [1]  Allergies Allergen Reactions   Peanuts [Peanut Oil] Swelling   "

## 2024-07-28 ENCOUNTER — Ambulatory Visit: Payer: Self-pay
